# Patient Record
Sex: Female | Born: 1965 | Race: Black or African American | Hispanic: No | Marital: Married | Smoking: Never smoker
Health system: Southern US, Community
[De-identification: ages and names within clinical notes are randomized; demographics above are authoritative.]

## PROBLEM LIST (undated history)

## (undated) ENCOUNTER — Emergency Department (HOSPITAL_COMMUNITY): Admission: EM | Disposition: A | Discharge status: Home / Self Care | Payer: Self-pay

## (undated) DIAGNOSIS — E669 Obesity, unspecified: Secondary | ICD-10-CM

## (undated) DIAGNOSIS — E119 Type 2 diabetes mellitus without complications: Secondary | ICD-10-CM

## (undated) DIAGNOSIS — I10 Essential (primary) hypertension: Secondary | ICD-10-CM

## (undated) HISTORY — DX: Obesity, unspecified: E66.9

---

## 1987-09-28 HISTORY — PX: TUBAL LIGATION: SHX77

## 1999-09-29 ENCOUNTER — Emergency Department (HOSPITAL_COMMUNITY): Admission: EM | Admit: 1999-09-29 | Discharge: 1999-09-29 | Payer: Self-pay | Admitting: Emergency Medicine

## 2001-08-24 ENCOUNTER — Emergency Department (HOSPITAL_COMMUNITY): Admission: EM | Admit: 2001-08-24 | Discharge: 2001-08-24 | Payer: Self-pay | Admitting: Emergency Medicine

## 2001-08-24 ENCOUNTER — Encounter: Payer: Self-pay | Admitting: Emergency Medicine

## 2005-03-12 ENCOUNTER — Emergency Department (HOSPITAL_COMMUNITY): Admission: EM | Admit: 2005-03-12 | Discharge: 2005-03-12 | Payer: Self-pay | Admitting: Family Medicine

## 2005-03-25 ENCOUNTER — Emergency Department (HOSPITAL_COMMUNITY): Admission: EM | Admit: 2005-03-25 | Discharge: 2005-03-25 | Payer: Self-pay | Admitting: Family Medicine

## 2005-03-26 ENCOUNTER — Emergency Department (HOSPITAL_COMMUNITY): Admission: EM | Admit: 2005-03-26 | Discharge: 2005-03-26 | Payer: Self-pay | Admitting: Emergency Medicine

## 2012-11-02 ENCOUNTER — Emergency Department (HOSPITAL_COMMUNITY): Payer: BC Managed Care – PPO

## 2012-11-02 ENCOUNTER — Emergency Department (HOSPITAL_COMMUNITY)
Admission: EM | Admit: 2012-11-02 | Discharge: 2012-11-02 | Disposition: A | Discharge status: Home / Self Care | Payer: BC Managed Care – PPO | Attending: Emergency Medicine | Admitting: Emergency Medicine

## 2012-11-02 ENCOUNTER — Encounter (HOSPITAL_COMMUNITY): Payer: Self-pay | Admitting: Emergency Medicine

## 2012-11-02 DIAGNOSIS — IMO0002 Reserved for concepts with insufficient information to code with codable children: Secondary | ICD-10-CM | POA: Insufficient documentation

## 2012-11-02 DIAGNOSIS — S7010XA Contusion of unspecified thigh, initial encounter: Secondary | ICD-10-CM | POA: Insufficient documentation

## 2012-11-02 DIAGNOSIS — Y92410 Unspecified street and highway as the place of occurrence of the external cause: Secondary | ICD-10-CM | POA: Insufficient documentation

## 2012-11-02 DIAGNOSIS — Z3202 Encounter for pregnancy test, result negative: Secondary | ICD-10-CM | POA: Insufficient documentation

## 2012-11-02 DIAGNOSIS — E119 Type 2 diabetes mellitus without complications: Secondary | ICD-10-CM | POA: Insufficient documentation

## 2012-11-02 DIAGNOSIS — S20219A Contusion of unspecified front wall of thorax, initial encounter: Secondary | ICD-10-CM | POA: Insufficient documentation

## 2012-11-02 DIAGNOSIS — S301XXA Contusion of abdominal wall, initial encounter: Secondary | ICD-10-CM | POA: Insufficient documentation

## 2012-11-02 DIAGNOSIS — S7011XA Contusion of right thigh, initial encounter: Secondary | ICD-10-CM

## 2012-11-02 DIAGNOSIS — Y9389 Activity, other specified: Secondary | ICD-10-CM | POA: Insufficient documentation

## 2012-11-02 HISTORY — DX: Type 2 diabetes mellitus without complications: E11.9

## 2012-11-02 LAB — CBC WITH DIFFERENTIAL/PLATELET
Basophils Absolute: 0 10*3/uL (ref 0.0–0.1)
Basophils Relative: 1 % (ref 0–1)
Eosinophils Absolute: 0.1 10*3/uL (ref 0.0–0.7)
Eosinophils Relative: 2 % (ref 0–5)
HCT: 34.2 % — ABNORMAL LOW (ref 36.0–46.0)
Hemoglobin: 11 g/dL — ABNORMAL LOW (ref 12.0–15.0)
Lymphocytes Relative: 33 % (ref 12–46)
Lymphs Abs: 1.4 10*3/uL (ref 0.7–4.0)
MCH: 22.8 pg — ABNORMAL LOW (ref 26.0–34.0)
MCHC: 32.2 g/dL (ref 30.0–36.0)
MCV: 70.8 fL — ABNORMAL LOW (ref 78.0–100.0)
Monocytes Absolute: 0.3 10*3/uL (ref 0.1–1.0)
Monocytes Relative: 6 % (ref 3–12)
Neutro Abs: 2.5 10*3/uL (ref 1.7–7.7)
Neutrophils Relative %: 58 % (ref 43–77)
Platelets: 256 10*3/uL (ref 150–400)
RBC: 4.83 MIL/uL (ref 3.87–5.11)
RDW: 16.4 % — ABNORMAL HIGH (ref 11.5–15.5)
WBC: 4.3 10*3/uL (ref 4.0–10.5)

## 2012-11-02 LAB — POCT PREGNANCY, URINE: Preg Test, Ur: NEGATIVE

## 2012-11-02 LAB — BASIC METABOLIC PANEL
BUN: 9 mg/dL (ref 6–23)
CO2: 26 mEq/L (ref 19–32)
Calcium: 8.9 mg/dL (ref 8.4–10.5)
Chloride: 105 mEq/L (ref 96–112)
Creatinine, Ser: 0.61 mg/dL (ref 0.50–1.10)
GFR calc Af Amer: >90 mL/min (ref >90)
GFR calc non Af Amer: >90 mL/min (ref >90)
Glucose, Bld: 130 mg/dL — ABNORMAL HIGH (ref 70–99)
Potassium: 3.8 mEq/L (ref 3.5–5.1)
Sodium: 139 mEq/L (ref 135–145)

## 2012-11-02 MED ORDER — SODIUM CHLORIDE 0.9 % IV SOLN
Freq: Once | INTRAVENOUS | Status: AC
  Administered 2012-11-02: 09:00:00 via INTRAVENOUS

## 2012-11-02 MED ORDER — OXYCODONE-ACETAMINOPHEN 5-325 MG PO TABS: 1.0000 | ORAL_TABLET | ORAL | Status: DC | PRN

## 2012-11-02 MED ORDER — HYDROMORPHONE HCL PF 1 MG/ML IJ SOLN
1.0000 mg | Freq: Once | INTRAMUSCULAR | Status: AC
  Administered 2012-11-02: 1 mg via INTRAVENOUS
  Filled 2012-11-02: qty 1

## 2012-11-02 MED ORDER — IOHEXOL 300 MG/ML  SOLN
100.0000 mL | Freq: Once | INTRAMUSCULAR | Status: AC | PRN
  Administered 2012-11-02: 100 mL via INTRAVENOUS

## 2012-11-02 MED ORDER — ONDANSETRON HCL 4 MG/2ML IJ SOLN
4.0000 mg | Freq: Once | INTRAMUSCULAR | Status: AC
  Administered 2012-11-02: 4 mg via INTRAVENOUS
  Filled 2012-11-02: qty 2
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
## 2012-11-07 ENCOUNTER — Emergency Department (INDEPENDENT_AMBULATORY_CARE_PROVIDER_SITE_OTHER)
Admission: EM | Admit: 2012-11-07 | Discharge: 2012-11-07 | Disposition: A | Discharge status: Home / Self Care | Payer: Self-pay | Source: Home / Self Care | Attending: Family Medicine | Admitting: Family Medicine

## 2012-11-07 ENCOUNTER — Encounter (HOSPITAL_COMMUNITY): Payer: Self-pay | Admitting: *Deleted

## 2012-11-07 ENCOUNTER — Emergency Department (INDEPENDENT_AMBULATORY_CARE_PROVIDER_SITE_OTHER): Payer: BC Managed Care – PPO

## 2012-11-07 DIAGNOSIS — M546 Pain in thoracic spine: Secondary | ICD-10-CM

## 2012-11-07 MED ORDER — CYCLOBENZAPRINE HCL 5 MG PO TABS: 5.0000 mg | ORAL_TABLET | Freq: Three times a day (TID) | ORAL | Status: DC | PRN

## 2012-11-07 MED ORDER — KETOROLAC TROMETHAMINE 10 MG PO TABS: 10.0000 mg | ORAL_TABLET | Freq: Four times a day (QID) | ORAL | Status: DC | PRN
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
## 2016-03-07 ENCOUNTER — Ambulatory Visit (HOSPITAL_COMMUNITY)
Admission: EM | Admit: 2016-03-07 | Discharge: 2016-03-07 | Disposition: A | Discharge status: Home / Self Care | Payer: BLUE CROSS/BLUE SHIELD | Attending: Emergency Medicine | Admitting: Emergency Medicine

## 2016-03-07 ENCOUNTER — Encounter (HOSPITAL_COMMUNITY): Payer: Self-pay | Admitting: Emergency Medicine

## 2016-03-07 DIAGNOSIS — B86 Scabies: Secondary | ICD-10-CM

## 2016-03-07 HISTORY — DX: Essential (primary) hypertension: I10

## 2016-03-07 MED ORDER — PERMETHRIN 5 % EX CREA: TOPICAL_CREAM | CUTANEOUS | Status: DC

## 2016-03-07 NOTE — ED Notes (Signed)
Rash, seen by dr Piedad Climeshonig only

## 2016-03-07 NOTE — Discharge Instructions (Signed)

## 2016-03-07 NOTE — ED Provider Notes (Addendum)
CSN: [id number]     Arrival date & time 03/07/16  1341 History   First MD Initiated Contact with Patient 03/07/16 1351     Chief Complaint  Patient presents with  . Rash   (Consider location/radiation/quality/duration/timing/severity/associated sxs/prior Treatment) HPI  She is a 50 year old woman here for evaluation of rash. She states this started last week on her hands and arms. It has spread to her upper chest, neck, and face. It is very itchy, particularly at night. She's been trying cortisone cream which provide some temporary relief of itching, but otherwise has not helped. She does work in the [employer] field. No new medications, detergents, soaps, or products.  Past Medical History  Diagnosis Date  . Diabetes mellitus without complication (HCC)   . Hypertension    No past surgical history on file. Family History  Problem Relation Age of Onset  . Family history unknown: Yes   Social History  Substance Use Topics  . Smoking status: Never Smoker   . Smokeless tobacco: Not on file  . Alcohol Use: No   OB History    No data available     Review of Systems As in history of present illness Allergies  Amoxicillin  Home Medications   Prior to Admission medications   Medication Sig Start Date End Date Taking? Authorizing Provider  metFORMIN (GLUCOPHAGE) 1000 MG tablet Take 1,000 mg by mouth 2 (two) times daily with a meal.   Yes Historical Provider, MD  valsartan (DIOVAN) 160 MG tablet Take 160 mg by mouth daily.   Yes Historical Provider, MD  permethrin (ELIMITE) 5 % cream Apply to entire body.  Leave on for 12 hours, then wash off.  Repeat in 1 week. 03/07/16   Charm RingsErin J Honig, MD   Meds Ordered and Administered this Visit  Medications - No data to display  BP 136/82 mmHg  Pulse 89  Temp(Src) 98.1 F (36.7 C) (Oral)  Resp 18  SpO2 96% No data found.   Physical Exam  Constitutional: She is oriented to person, place, and time. She appears well-developed and  well-nourished. No distress.  Cardiovascular: Normal rate.   Pulmonary/Chest: Effort normal.  Neurological: She is alert and oriented to person, place, and time.  Skin: Rash (small flesh-colored papules on hands and arms with overlying excoriations. She has also had several lesions on the neck and face.) noted.    ED Course  Procedures (including critical care time)  Labs Review Labs Reviewed - No data to display  Imaging Review No results found.    MDM   1. Scabies    Consistent with scabies. We'll treat with permethrin. Handout given. Follow-up as needed.    Charm RingsErin J Honig, MD 03/07/16 1413  Charm RingsErin J Honig, MD 03/07/16 1414

## 2016-03-22 ENCOUNTER — Other Ambulatory Visit: Payer: Self-pay | Admitting: Internal Medicine

## 2016-03-22 DIAGNOSIS — Z1231 Encounter for screening mammogram for malignant neoplasm of breast: Secondary | ICD-10-CM

## 2016-04-08 ENCOUNTER — Ambulatory Visit: Payer: BLUE CROSS/BLUE SHIELD

## 2016-04-20 ENCOUNTER — Ambulatory Visit: Payer: BLUE CROSS/BLUE SHIELD

## 2016-04-20 ENCOUNTER — Inpatient Hospital Stay: Admission: RE | Admit: 2016-04-20 | Payer: BLUE CROSS/BLUE SHIELD | Source: Ambulatory Visit

## 2017-01-26 ENCOUNTER — Ambulatory Visit (INDEPENDENT_AMBULATORY_CARE_PROVIDER_SITE_OTHER): Payer: BLUE CROSS/BLUE SHIELD | Admitting: Podiatry

## 2017-01-26 ENCOUNTER — Ambulatory Visit (INDEPENDENT_AMBULATORY_CARE_PROVIDER_SITE_OTHER): Payer: BLUE CROSS/BLUE SHIELD

## 2017-01-26 ENCOUNTER — Encounter: Payer: Self-pay | Admitting: Podiatry

## 2017-01-26 ENCOUNTER — Other Ambulatory Visit: Payer: Self-pay | Admitting: Podiatry

## 2017-01-26 VITALS — BP 161/103 | HR 84

## 2017-01-26 DIAGNOSIS — M2010 Hallux valgus (acquired), unspecified foot: Secondary | ICD-10-CM | POA: Diagnosis not present

## 2017-01-26 DIAGNOSIS — M129 Arthropathy, unspecified: Secondary | ICD-10-CM | POA: Diagnosis not present

## 2017-01-26 DIAGNOSIS — R52 Pain, unspecified: Secondary | ICD-10-CM

## 2017-01-26 DIAGNOSIS — M722 Plantar fascial fibromatosis: Secondary | ICD-10-CM

## 2017-01-26 MED ORDER — MELOXICAM 15 MG PO TABS: 15.0000 mg | ORAL_TABLET | Freq: Every day | ORAL | 0 refills | Status: DC

## 2017-01-26 NOTE — Progress Notes (Signed)
   Subjective:    Patient ID: Bridget Bates, female    DOB: [birth year], 51 y.o.   MRN: [id number]  HPI this patient presents the office with chief complaint of pain in both feet after standing long periods of time. She says this pain and discomfort has been present for over a year and it is worsening the last few weeks. Patient states that she is diabetic on metformin. Patient points to the bottom of her foot and the big toe joint as areas of pain after standing periods of time. She says she has provided no self treatment nor sought any professional help. No history of trauma is noted. She presents the office today for an evaluation and treatment for her painful diabetic feet    Review of Systems  Eyes: Positive for pain.       Objective:   Physical Exam GENERAL APPEARANCE: Alert, conversant. Appropriately groomed. No acute distress.  VASCULAR: Pedal pulses are  palpable at  [facility] and PT bilateral.  Capillary refill time is immediate to all digits,  Normal temperature gradient.  Digital hair growth is present bilateral  NEUROLOGIC: sensation is normal to 5.07 monofilament at 5/5 sites bilateral.  Light touch is intact bilateral, Muscle strength normal.  MUSCULOSKELETAL: acceptable muscle strength, tone and stability bilateral.  HAV  B/L.  Plantar fascitis center of both feet with left more painful than right foot.  Dorsal midfoot arthritis   DERMATOLOGIC: skin color, texture, and turgor are within normal limits.  No preulcerative lesions or ulcers  are seen, no interdigital maceration noted.  No open lesions present.  Digital nails are asymptomatic. No drainage noted.         Assessment & Plan:  Plantar fascitis  B/L  HAV  B/L  Midfoot arthritis  B/L   IE  , AP and lateral x-rays of both feet were taken. There is calcification noted at both the insertion of the plantar fascia and the insertion of the Achilles tendon.  Dorsal arthritis is noted in the midfoot.  Dorsomedial exostosis noted at  the first MPJ bilateral consistant with HAV.Marland Kitchen  After discussing her  foot pathology  ,  we decided to treat her with power step insoles to be worn in her shoes. We also decided to prescribe Mobic # 30  One daily. She is to return to the office in 2 weeks at which time we will evaluate her progress with the powerstep insoles and Mobic.  RTC 2 weeks.   Helane Gunther DPM

## 2017-02-16 ENCOUNTER — Ambulatory Visit: Payer: BLUE CROSS/BLUE SHIELD | Admitting: Podiatry

## 2017-03-01 ENCOUNTER — Ambulatory Visit: Payer: BLUE CROSS/BLUE SHIELD | Admitting: Podiatry

## 2017-03-08 ENCOUNTER — Ambulatory Visit: Payer: BLUE CROSS/BLUE SHIELD | Admitting: Podiatry

## 2017-10-10 ENCOUNTER — Encounter (HOSPITAL_COMMUNITY): Payer: Self-pay | Admitting: Emergency Medicine

## 2017-10-10 ENCOUNTER — Ambulatory Visit (HOSPITAL_COMMUNITY)
Admission: EM | Admit: 2017-10-10 | Discharge: 2017-10-10 | Disposition: A | Discharge status: Home / Self Care | Payer: BLUE CROSS/BLUE SHIELD | Attending: Family Medicine | Admitting: Family Medicine

## 2017-10-10 DIAGNOSIS — S39012A Strain of muscle, fascia and tendon of lower back, initial encounter: Secondary | ICD-10-CM | POA: Diagnosis not present

## 2017-10-10 DIAGNOSIS — M5489 Other dorsalgia: Secondary | ICD-10-CM

## 2017-10-10 LAB — POCT URINALYSIS DIP (DEVICE)
Bilirubin Urine: NEGATIVE
Glucose, UA: NEGATIVE mg/dL
Hgb urine dipstick: NEGATIVE
Ketones, ur: NEGATIVE mg/dL
Leukocytes, UA: NEGATIVE
Nitrite: NEGATIVE
Protein, ur: 30 mg/dL — AB
Specific Gravity, Urine: 1.02 (ref 1.005–1.030)
Urobilinogen, UA: 0.2 mg/dL (ref 0.0–1.0)
pH: 6 (ref 5.0–8.0)

## 2017-10-10 MED ORDER — CYCLOBENZAPRINE HCL 5 MG PO TABS: 5.0000 mg | ORAL_TABLET | Freq: Every day | ORAL | 0 refills | Status: DC

## 2017-10-10 NOTE — ED Triage Notes (Signed)
PT reports back pain over left back. PT reports pain moves around, but feels muscular in nature. PT has no known strains or injuries.
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
## 2017-10-10 NOTE — Discharge Instructions (Signed)
This appears to be a muscle strain.    Apply heat to the area when possible.  A muscle relaxer to speed healing has been prescribed to be taken before bed.  Your blood pressure was elevated today.  Make sure you remember to take your blood pressure medicine and monitor this.  Report repeated elevated readings to your doctor

## 2017-12-01 ENCOUNTER — Other Ambulatory Visit: Payer: Self-pay | Admitting: Internal Medicine

## 2017-12-01 DIAGNOSIS — Z1231 Encounter for screening mammogram for malignant neoplasm of breast: Secondary | ICD-10-CM

## 2017-12-29 ENCOUNTER — Ambulatory Visit
Admission: RE | Admit: 2017-12-29 | Discharge: 2017-12-29 | Disposition: A | Discharge status: Home / Self Care | Payer: BLUE CROSS/BLUE SHIELD | Source: Ambulatory Visit | Attending: Internal Medicine | Admitting: Internal Medicine

## 2017-12-29 DIAGNOSIS — Z1231 Encounter for screening mammogram for malignant neoplasm of breast: Secondary | ICD-10-CM

## 2018-08-10 ENCOUNTER — Other Ambulatory Visit: Payer: Self-pay | Admitting: Nurse Practitioner

## 2018-08-16 ENCOUNTER — Other Ambulatory Visit: Payer: Self-pay | Admitting: Internal Medicine

## 2018-09-01 ENCOUNTER — Encounter: Payer: Self-pay | Admitting: Internal Medicine

## 2018-09-01 ENCOUNTER — Ambulatory Visit (INDEPENDENT_AMBULATORY_CARE_PROVIDER_SITE_OTHER): Payer: BLUE CROSS/BLUE SHIELD | Admitting: Internal Medicine

## 2018-09-01 VITALS — BP 156/90 | HR 75 | Temp 98.1°F | Ht 60.0 in | Wt 235.6 lb

## 2018-09-01 DIAGNOSIS — I129 Hypertensive chronic kidney disease with stage 1 through stage 4 chronic kidney disease, or unspecified chronic kidney disease: Secondary | ICD-10-CM | POA: Diagnosis not present

## 2018-09-01 DIAGNOSIS — N181 Chronic kidney disease, stage 1: Secondary | ICD-10-CM | POA: Diagnosis not present

## 2018-09-01 DIAGNOSIS — E1122 Type 2 diabetes mellitus with diabetic chronic kidney disease: Secondary | ICD-10-CM | POA: Diagnosis not present

## 2018-09-01 DIAGNOSIS — R0982 Postnasal drip: Secondary | ICD-10-CM | POA: Diagnosis not present

## 2018-09-01 DIAGNOSIS — Z6841 Body Mass Index (BMI) 40.0 and over, adult: Secondary | ICD-10-CM

## 2018-09-01 MED ORDER — METFORMIN HCL 500 MG PO TABS: 500.0000 mg | ORAL_TABLET | Freq: Two times a day (BID) | ORAL | 1 refills | Status: DC

## 2018-09-01 MED ORDER — TELMISARTAN 40 MG PO TABS: 40.0000 mg | ORAL_TABLET | Freq: Every day | ORAL | 1 refills | Status: DC

## 2018-09-01 NOTE — Patient Instructions (Signed)

## 2018-09-02 LAB — CMP14+EGFR
ALT: 6 IU/L (ref 0–32)
AST: 16 IU/L (ref 0–40)
Albumin/Globulin Ratio: 1.2 (ref 1.2–2.2)
Albumin: 4.1 g/dL (ref 3.5–5.5)
Alkaline Phosphatase: 103 IU/L (ref 39–117)
BUN/Creatinine Ratio: 16 (ref 9–23)
BUN: 9 mg/dL (ref 6–24)
Bilirubin Total: 0.4 mg/dL (ref 0.0–1.2)
CO2: 22 mmol/L (ref 20–29)
Calcium: 9.4 mg/dL (ref 8.7–10.2)
Chloride: 100 mmol/L (ref 96–106)
Creatinine, Ser: 0.58 mg/dL (ref 0.57–1.00)
GFR calc Af Amer: 123 mL/min/{1.73_m2} (ref >59)
GFR calc non Af Amer: 107 mL/min/{1.73_m2} (ref >59)
Globulin, Total: 3.5 g/dL (ref 1.5–4.5)
Glucose: 116 mg/dL — ABNORMAL HIGH (ref 65–99)
Potassium: 3.8 mmol/L (ref 3.5–5.2)
Sodium: 139 mmol/L (ref 134–144)
Total Protein: 7.6 g/dL (ref 6.0–8.5)

## 2018-09-02 LAB — HEMOGLOBIN A1C
Est. average glucose Bld gHb Est-mCnc: 166 mg/dL
Hgb A1c MFr Bld: 7.4 % — ABNORMAL HIGH (ref 4.8–5.6)

## 2018-09-02 LAB — LIPID PANEL
Chol/HDL Ratio: 2.9 ratio (ref 0.0–4.4)
Cholesterol, Total: 161 mg/dL (ref 100–199)
HDL: 55 mg/dL (ref >39)
LDL Calculated: 84 mg/dL (ref 0–99)
Triglycerides: 109 mg/dL (ref 0–149)
VLDL Cholesterol Cal: 22 mg/dL (ref 5–40)

## 2018-09-03 ENCOUNTER — Encounter: Payer: Self-pay | Admitting: Internal Medicine

## 2018-09-03 NOTE — Progress Notes (Signed)
Subjective:     Patient ID: Bridget Bates , female    DOB: 21-Jun-1966 , 52 y.o.   MRN: [id number]   Chief Complaint  Patient presents with  . Diabetes  . Hypertension  . Cough    HPI  Diabetes  She presents for her follow-up diabetic visit. She has type 2 diabetes mellitus. Her disease course has been stable. There are no hypoglycemic associated symptoms. Pertinent negatives for hypoglycemia include no headaches. There are no diabetic associated symptoms. Pertinent negatives for diabetes include no blurred vision and no chest pain. There are no hypoglycemic complications. Diabetic complications include nephropathy. Risk factors for coronary artery disease include diabetes mellitus, dyslipidemia, hypertension, obesity and sedentary lifestyle. She is compliant with treatment most of the time.  Hypertension  This is a chronic problem. The current episode started more than 1 year ago. The problem has been gradually improving since onset. Pertinent negatives include no blurred vision, chest pain, headaches, palpitations or shortness of breath.  Cough  This is a recurrent problem. The current episode started 1 to 4 weeks ago. The problem has been gradually improving. The problem occurs every few hours. The cough is non-productive. Pertinent negatives include no chest pain, headaches or shortness of breath.     Past Medical History:  Diagnosis Date  . Diabetes mellitus without complication ([place])   . Hypertension      Family History  Problem Relation Age of Onset  . Diabetes Mother   . Healthy Father      Current Outpatient Medications:  .  cyclobenzaprine (FLEXERIL) 5 MG tablet, Take 1 tablet (5 mg total) by mouth at bedtime., Disp: 7 tablet, Rfl: 0 .  Doxepin HCl 5 % CREA, APPLY 1 GRAM (1 PUMP) TO THE AFFECTED AREA(S) 3 TIMES DAILY AS NEEDED. (0.5 GRAMS=PEA SIZED AMOUNT), Disp: , Rfl: 94 .  glucose blood (ONETOUCH VERIO) test strip, 1 each by Other route as needed for other. Use as  instructed, Disp: , Rfl:  .  metFORMIN (GLUCOPHAGE) 500 MG tablet, Take 1 tablet (500 mg total) by mouth 2 (two) times daily with a meal., Disp: 180 tablet, Rfl: 1 .  simvastatin (ZOCOR) 10 MG tablet, Take 10 mg by mouth daily., Disp: , Rfl:  .  telmisartan (MICARDIS) 40 MG tablet, Take 1 tablet (40 mg total) by mouth daily., Disp: 90 tablet, Rfl: 1   Allergies  Allergen Reactions  . Amoxicillin Hives and Itching     Review of Systems  Constitutional: Negative.   Eyes: Negative for blurred vision.  Respiratory: Positive for cough. Negative for shortness of breath.   Cardiovascular: Negative.  Negative for chest pain and palpitations.  Gastrointestinal: Negative.   Genitourinary: Negative.   Neurological: Negative.  Negative for headaches.  Psychiatric/Behavioral: Negative.      Today's Vitals   09/01/18 1500  BP: (!) 156/90  Pulse: 75  Temp: 98.1 F (36.7 C)  TempSrc: Oral  Weight: 235 lb 9.6 oz (106.9 kg)  Height: 5' (1.524 m)   Body mass index is 46.01 kg/m.   Objective:  Physical Exam  Constitutional: She appears well-developed and well-nourished.  obese  HENT:  Head: Normocephalic and atraumatic.  Right Ear: Hearing, tympanic membrane, external ear and ear canal normal.  Left Ear: Hearing, tympanic membrane, external ear and ear canal normal.  Nose: Nose normal.  Eyes: EOM are normal.  Cardiovascular: Normal rate, regular rhythm and normal heart sounds.  Pulmonary/Chest: Effort normal and breath sounds normal.  Nursing note and vitals  reviewed.       Assessment And Plan:     1. Diabetes mellitus with stage 1 chronic kidney disease ([place])  I will check labs as listed below. She is encouraged to incorporate more exercise into her daily routine. She is also encouraged to avoid sugary beverages and processed foods.   - CMP14+EGFR - Hemoglobin A1c - Lipid Profile  2. Hypertensive nephropathy  Uncontrolled. She does not wish to change her meds. She is  encouraged to avoid adding salt to her foods. She is encouraged to exercise no less than five days weekly.   3. Chronic renal disease, stage I  Chronic.   4. Postnasal drip  She was given samples of [place] AD to take twice daily as needed. She will let me know if her sx persist   5. Class 3 severe obesity due to excess calories with serious comorbidity and body mass index (BMI) of 45.0 to 49.9 in adult [facility])  She is encouraged to strive for BMI less than 40 to decrease cardiac risk.  Again, importance of regular exercise was discussed with the patient.   Maximino Greenland, MD

## 2018-11-21 ENCOUNTER — Other Ambulatory Visit: Payer: Self-pay | Admitting: Internal Medicine

## 2018-12-04 ENCOUNTER — Ambulatory Visit: Payer: BLUE CROSS/BLUE SHIELD | Admitting: Internal Medicine

## 2018-12-04 ENCOUNTER — Encounter: Payer: Self-pay | Admitting: Internal Medicine

## 2018-12-04 ENCOUNTER — Other Ambulatory Visit: Payer: Self-pay | Admitting: Internal Medicine

## 2018-12-04 VITALS — BP 142/90 | HR 82 | Temp 98.3°F | Ht 60.0 in | Wt 233.6 lb

## 2018-12-04 DIAGNOSIS — Z6841 Body Mass Index (BMI) 40.0 and over, adult: Secondary | ICD-10-CM

## 2018-12-04 DIAGNOSIS — N181 Chronic kidney disease, stage 1: Secondary | ICD-10-CM | POA: Diagnosis not present

## 2018-12-04 DIAGNOSIS — E1122 Type 2 diabetes mellitus with diabetic chronic kidney disease: Secondary | ICD-10-CM

## 2018-12-04 DIAGNOSIS — I129 Hypertensive chronic kidney disease with stage 1 through stage 4 chronic kidney disease, or unspecified chronic kidney disease: Secondary | ICD-10-CM

## 2018-12-04 MED ORDER — TELMISARTAN 80 MG PO TABS: 80.0000 mg | ORAL_TABLET | Freq: Every day | ORAL | 1 refills | Status: DC

## 2018-12-04 NOTE — Progress Notes (Signed)
Subjective:     Patient ID: Bridget Bates , female    DOB: 11-21-1965 , 53 y.o.   MRN: [id number]   Chief Complaint  Patient presents with  . Hypertension  . Diabetes    HPI  Hypertension  This is a chronic problem. The current episode started more than 1 year ago. The problem has been gradually improving since onset. The problem is uncontrolled. Pertinent negatives include no blurred vision, chest pain, palpitations or shortness of breath. Risk factors for coronary artery disease include diabetes mellitus, dyslipidemia and obesity. Hypertensive end-organ damage includes kidney disease.  Diabetes  She presents for her follow-up diabetic visit. She has type 2 diabetes mellitus. There are no hypoglycemic associated symptoms. Pertinent negatives for diabetes include no blurred vision and no chest pain. There are no hypoglycemic complications.     Past Medical History:  Diagnosis Date  . Diabetes mellitus without complication ([place])   . Hypertension      Family History  Problem Relation Age of Onset  . Diabetes Mother   . Healthy Father      Current Outpatient Medications:  .  glucose blood (ONETOUCH VERIO) test strip, 1 each by Other route as needed for other. Use as instructed, Disp: , Rfl:  .  metFORMIN (GLUCOPHAGE) 500 MG tablet, TAKE 1 TABLET BY MOUTH TWICE DAILY BREAKFAST AND EVENING MEAL, Disp: 180 tablet, Rfl: 1 .  simvastatin (ZOCOR) 10 MG tablet, Take 10 mg by mouth daily., Disp: , Rfl:  .  telmisartan (MICARDIS) 40 MG tablet, Take 1 tablet (40 mg total) by mouth daily., Disp: 90 tablet, Rfl: 1   Allergies  Allergen Reactions  . Amoxicillin Hives and Itching     Review of Systems  Constitutional: Negative.   Eyes: Negative for blurred vision.  Respiratory: Negative.  Negative for shortness of breath.   Cardiovascular: Negative.  Negative for chest pain and palpitations.  Gastrointestinal: Negative.   Neurological: Negative.   Psychiatric/Behavioral: Negative.       Today's Vitals   12/04/18 1149  BP: (!) 142/90  Pulse: 82  Temp: 98.3 F (36.8 C)  TempSrc: Oral  Weight: 233 lb 9.6 oz (106 kg)  Height: 5' (1.524 m)   Body mass index is 45.62 kg/m.   Objective:  Physical Exam Vitals signs and nursing note reviewed.  Constitutional:      Appearance: Normal appearance.  HENT:     Head: Normocephalic and atraumatic.  Cardiovascular:     Rate and Rhythm: Normal rate and regular rhythm.     Heart sounds: Normal heart sounds.  Pulmonary:     Effort: Pulmonary effort is normal.     Breath sounds: Normal breath sounds.  Skin:    General: Skin is warm.  Neurological:     General: No focal deficit present.     Mental Status: She is alert.  Psychiatric:        Mood and Affect: Mood normal.        Behavior: Behavior normal.         Assessment And Plan:     1. Hypertensive nephropathy  Fair control. I will increase telmisartan 42m to 854monce daily. She will take 2 tablets until she runs out. I will also send rx telmisartan 8071mnce daily. She will rto in 6 weeks for re-evaluation.   - HIV antibody (with reflex)  2. Chronic renal disease, stage I  Chronic. I will check GFR, Cr today.   3. Diabetes mellitus with stage 1  chronic kidney disease ([place])  I will check labs as listed below.  She is encouraged to exercise 30 minutes five days weekly.   - TSH - BMP8+EGFR - Hemoglobin A1c  4. Class 3 severe obesity due to excess calories with serious comorbidity and body mass index (BMI) of 45.0 to 49.9 in adult [facility])  Importance of achieving optimal weight to decrease risk of cardiovascular disease and cancers was discussed with the patient in full detail. She is encouraged to start slowly - start with 10 minutes twice daily at least three to four days per week and to gradually build to 30 minutes five days weekly. She was given tips to incorporate more activity into her daily routine - take stairs when possible, park farther away  from her job, grocery stores, etc. .     Maximino Greenland, MD

## 2018-12-04 NOTE — Patient Instructions (Signed)

## 2018-12-05 LAB — BMP8+EGFR
BUN/Creatinine Ratio: 14 (ref 9–23)
BUN: 8 mg/dL (ref 6–24)
CO2: 22 mmol/L (ref 20–29)
Calcium: 9.3 mg/dL (ref 8.7–10.2)
Chloride: 100 mmol/L (ref 96–106)
Creatinine, Ser: 0.56 mg/dL — ABNORMAL LOW (ref 0.57–1.00)
GFR calc Af Amer: 125 mL/min/{1.73_m2} (ref >59)
GFR calc non Af Amer: 108 mL/min/{1.73_m2} (ref >59)
Glucose: 118 mg/dL — ABNORMAL HIGH (ref 65–99)
Potassium: 4.2 mmol/L (ref 3.5–5.2)
Sodium: 139 mmol/L (ref 134–144)

## 2018-12-05 LAB — HEMOGLOBIN A1C
Est. average glucose Bld gHb Est-mCnc: 160 mg/dL
Hgb A1c MFr Bld: 7.2 % — ABNORMAL HIGH (ref 4.8–5.6)

## 2018-12-05 LAB — HIV ANTIBODY (ROUTINE TESTING W REFLEX): HIV Screen 4th Generation wRfx: NONREACTIVE

## 2018-12-05 LAB — TSH: TSH: 0.558 u[IU]/mL (ref 0.450–4.500)

## 2019-01-10 ENCOUNTER — Other Ambulatory Visit: Payer: Self-pay | Admitting: Internal Medicine

## 2019-01-17 ENCOUNTER — Ambulatory Visit: Payer: BLUE CROSS/BLUE SHIELD | Admitting: Internal Medicine

## 2019-03-06 ENCOUNTER — Other Ambulatory Visit: Payer: Self-pay | Admitting: Internal Medicine

## 2019-03-09 ENCOUNTER — Telehealth: Payer: Self-pay

## 2019-03-09 ENCOUNTER — Other Ambulatory Visit: Payer: Self-pay

## 2019-03-09 MED ORDER — TELMISARTAN 80 MG PO TABS: ORAL_TABLET | ORAL | 0 refills | Status: DC

## 2019-03-09 NOTE — Telephone Encounter (Signed)
The pt called and wanted to cancel her appt for Monday because her sister has the coronavirus, her son went to the sister's house and now she has to be tested.  The pt said that her job set her up to have the test done and she gets it done today.

## 2019-03-12 ENCOUNTER — Encounter: Payer: BLUE CROSS/BLUE SHIELD | Admitting: Internal Medicine

## 2019-03-16 ENCOUNTER — Other Ambulatory Visit: Payer: Self-pay | Admitting: Internal Medicine

## 2019-04-02 ENCOUNTER — Encounter: Payer: Self-pay | Admitting: Nurse Practitioner

## 2019-04-02 ENCOUNTER — Other Ambulatory Visit: Payer: Self-pay | Admitting: Internal Medicine

## 2019-04-02 ENCOUNTER — Ambulatory Visit: Payer: BC Managed Care – PPO | Admitting: Internal Medicine

## 2019-04-02 ENCOUNTER — Other Ambulatory Visit: Payer: Self-pay

## 2019-04-02 VITALS — BP 160/98 | HR 100 | Temp 98.5°F | Ht 60.0 in | Wt 229.6 lb

## 2019-04-02 DIAGNOSIS — N181 Chronic kidney disease, stage 1: Secondary | ICD-10-CM

## 2019-04-02 DIAGNOSIS — Z0000 Encounter for general adult medical examination without abnormal findings: Secondary | ICD-10-CM

## 2019-04-02 DIAGNOSIS — I129 Hypertensive chronic kidney disease with stage 1 through stage 4 chronic kidney disease, or unspecified chronic kidney disease: Secondary | ICD-10-CM | POA: Diagnosis not present

## 2019-04-02 DIAGNOSIS — E1122 Type 2 diabetes mellitus with diabetic chronic kidney disease: Secondary | ICD-10-CM | POA: Diagnosis not present

## 2019-04-02 DIAGNOSIS — N1831 Chronic kidney disease, stage 3a: Secondary | ICD-10-CM | POA: Insufficient documentation

## 2019-04-02 LAB — POCT UA - MICROALBUMIN
Albumin/Creatinine Ratio, Urine, POC: 300
Creatinine, POC: 200 mg/dL
Microalbumin Ur, POC: 150 mg/L

## 2019-04-02 LAB — POCT URINALYSIS DIPSTICK
Bilirubin, UA: NEGATIVE
Blood, UA: NEGATIVE
Glucose, UA: NEGATIVE
Ketones, UA: NEGATIVE
Leukocytes, UA: NEGATIVE
Nitrite, UA: NEGATIVE
Protein, UA: NEGATIVE
Spec Grav, UA: 1.02 (ref 1.010–1.025)
Urobilinogen, UA: 0.2 E.U./dL
pH, UA: 5.5 (ref 5.0–8.0)

## 2019-04-02 MED ORDER — CHLORTHALIDONE 25 MG PO TABS: 25.0000 mg | ORAL_TABLET | Freq: Every day | ORAL | 1 refills | Status: DC

## 2019-04-02 NOTE — Progress Notes (Signed)
Subjective:     Patient ID: Bridget Bates , female    DOB: 27-Sep-1966 , 53 y.o.   MRN: [id number]   Chief Complaint  Patient presents with  . Annual Exam    HPI  She is here today for a full physical examination. She is followed by GYN for her pelvic exams.   Diabetes She presents for her follow-up diabetic visit. She has type 2 diabetes mellitus. Her disease course has been stable. There are no hypoglycemic associated symptoms. Pertinent negatives for diabetes include no blurred vision and no chest pain. There are no hypoglycemic complications. Risk factors for coronary artery disease include diabetes mellitus, dyslipidemia, hypertension, sedentary lifestyle and obesity. She never participates in exercise. Her breakfast blood glucose is taken between 8-9 am. Her breakfast blood glucose range is generally 110-130 mg/dl. An ACE inhibitor/angiotensin II receptor blocker is being taken. Eye exam is not current.  Hypertension This is a chronic problem. The current episode started more than 1 year ago. The problem has been gradually improving since onset. The problem is uncontrolled. Pertinent negatives include no blurred vision, chest pain, palpitations or shortness of breath.     Past Medical History:  Diagnosis Date  . Diabetes mellitus without complication ([place])   . Hypertension      Family History  Problem Relation Age of Onset  . Diabetes Mother   . Healthy Father      Current Outpatient Medications:  .  glucose blood (ONETOUCH VERIO) test strip, 1 each by Other route as needed for other. Use as instructed, Disp: , Rfl:  .  metFORMIN (GLUCOPHAGE) 500 MG tablet, TAKE 1 TABLET BY MOUTH TWICE DAILY BREAKFAST AND EVENING MEAL, Disp: 180 tablet, Rfl: 1 .  simvastatin (ZOCOR) 10 MG tablet, TAKE 1 TABLET BY MOUTH EVERY DAY IN THE EVENING, Disp: 90 tablet, Rfl: 0 .  telmisartan (MICARDIS) 80 MG tablet, TAKE 1 TABLET(80 MG) BY MOUTH DAILY, Disp: 90 tablet, Rfl: 0 .  chlorthalidone  (HYGROTON) 25 MG tablet, Take 1 tablet (25 mg total) by mouth daily., Disp: 30 tablet, Rfl: 1   Allergies  Allergen Reactions  . Amoxicillin Hives and Itching     The patient states she uses none for birth control. Last LMP was No LMP recorded. (Menstrual status: Perimenopausal).. Negative for Dysmenorrhea Negative for: breast discharge, breast lump(s), breast pain and breast self exam. Associated symptoms include abnormal vaginal bleeding. Pertinent negatives include abnormal bleeding (hematology), anxiety, decreased libido, depression, difficulty falling sleep, dyspareunia, history of infertility, nocturia, sexual dysfunction, sleep disturbances, urinary incontinence, urinary urgency, vaginal discharge and vaginal itching. Diet regular.The patient states her exercise level is  minimal.   . The patient's tobacco use is:  Social History   Tobacco Use  Smoking Status Never Smoker  Smokeless Tobacco Never Used  . She has been exposed to passive smoke. The patient's alcohol use is:  Social History   Substance and Sexual Activity  Alcohol Use No     Review of Systems  Constitutional: Negative.   HENT: Negative.   Eyes: Negative.  Negative for blurred vision.  Respiratory: Negative.  Negative for shortness of breath.   Cardiovascular: Negative.  Negative for chest pain and palpitations.  Endocrine: Negative.   Genitourinary: Negative.   Musculoskeletal: Negative.   Skin: Negative.   Allergic/Immunologic: Negative.   Neurological: Negative.   Hematological: Negative.   Psychiatric/Behavioral: Negative.      Today's Vitals   04/02/19 1210  BP: (!) 160/98  Pulse: 100  Temp:  98.5 F (36.9 C)  TempSrc: Oral  Weight: 229 lb 9.6 oz (104.1 kg)  Height: 5' (1.524 m)  PainSc: 0-No pain   Body mass index is 44.84 kg/m.   Objective:  Physical Exam Vitals signs and nursing note reviewed.  Constitutional:      Appearance: Normal appearance. She is obese.  HENT:     Head:  Normocephalic and atraumatic.     Right Ear: Tympanic membrane, ear canal and external ear normal.     Left Ear: Tympanic membrane, ear canal and external ear normal.     Nose: Nose normal.     Mouth/Throat:     Mouth: Mucous membranes are moist.     Pharynx: Oropharynx is clear.  Eyes:     Extraocular Movements: Extraocular movements intact.     Conjunctiva/sclera: Conjunctivae normal.     Pupils: Pupils are equal, round, and reactive to light.  Neck:     Musculoskeletal: Normal range of motion and neck supple.  Cardiovascular:     Rate and Rhythm: Normal rate and regular rhythm.     Pulses: Normal pulses.          Dorsalis pedis pulses are 2+ on the right side and 2+ on the left side.       Posterior tibial pulses are 2+ on the right side and 2+ on the left side.     Heart sounds: Normal heart sounds.  Pulmonary:     Effort: Pulmonary effort is normal.     Breath sounds: Normal breath sounds.  Chest:     Breasts:        Right: Normal. No swelling, bleeding, inverted nipple, mass, nipple discharge or skin change.        Left: Normal. No swelling, bleeding, inverted nipple, mass, nipple discharge or skin change.  Abdominal:     General: Abdomen is flat. Bowel sounds are normal.     Palpations: Abdomen is soft.  Genitourinary:    Comments: deferred Musculoskeletal: Normal range of motion.  Feet:     Right foot:     Protective Sensation: 5 sites tested. 5 sites sensed.     Skin integrity: Skin integrity normal.     Toenail Condition: Right toenails are normal.     Left foot:     Protective Sensation: 5 sites tested. 5 sites sensed.     Skin integrity: Skin integrity normal.     Toenail Condition: Left toenails are normal.  Skin:    General: Skin is warm and dry.  Neurological:     General: No focal deficit present.     Mental Status: She is alert and oriented to person, place, and time.  Psychiatric:        Mood and Affect: Mood normal.        Behavior: Behavior normal.          Assessment And Plan:     1. Routine general medical examination at health care facility  A full exam was performed. Importance of monthly self breast exams was discussed with the patient. I will request a copy of her colonoscopy report from Dr. Collene Mares.  PATIENT HAS BEEN ADVISED TO GET 30-45 MINUTES REGULAR EXERCISE NO LESS THAN FOUR TO FIVE DAYS PER WEEK - BOTH WEIGHTBEARING EXERCISES AND AEROBIC ARE RECOMMENDED.  SHE IS ADVISED TO FOLLOW A HEALTHY DIET WITH AT LEAST SIX FRUITS/VEGGIES PER DAY, DECREASE INTAKE OF RED MEAT, AND TO INCREASE FISH INTAKE TO TWO DAYS PER WEEK.  MEATS/FISH SHOULD  NOT BE FRIED, BAKED OR BROILED IS PREFERABLE.  I SUGGEST WEARING SPF 50 SUNSCREEN ON EXPOSED PARTS AND ESPECIALLY WHEN IN THE DIRECT SUNLIGHT FOR AN EXTENDED PERIOD OF TIME.  PLEASE AVOID FAST FOOD RESTAURANTS AND INCREASE YOUR WATER INTAKE.  - Lipid Profile - CBC no Diff - Vitamin D (25 hydroxy) - Hemoglobin A1c - CMP14+EGFR  2. 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  WE DISCUSSED STARTING OZEMPIC TO HELP HER ACHIEVE GLYCEMIC CONTROL. SHE WILL START WITH 0.25MG ONCE WEEKLY X 4 WEEKS, THEN 0.5MG ONCE WEEKLY X 2 WEEKS. SHE IS GLP-1 NAIVE. SHE WAS INSTRUCTED ON HOW TO SELF ADMINISTER THE MEDICATION.  SHE DENIES FAMILY HISTORY OF THYROID CANCER. SHE WILL RTO IN SIX WEEKS FOR RE-EVALUATION. DISCUSSED WITH THE PATIENT AT LENGTH REGARDING THE GOALS OF GLYCEMIC CONTROL AND POSSIBLE LONG-TERM COMPLICATIONS.  I  ALSO STRESSED THE IMPORTANCE OF COMPLIANCE WITH HOME GLUCOSE MONITORING, DIETARY RESTRICTIONS INCLUDING AVOIDANCE OF SUGARY DRINKS/PROCESSED FOODS,  ALONG WITH REGULAR EXERCISE.  I  ALSO STRESSED THE IMPORTANCE OF ANNUAL EYE EXAMS, SELF FOOT CARE AND COMPLIANCE WITH OFFICE VISITS.   3. Hypertensive nephropathy  Uncontrolled. I will add chlorthalidone 51m once daily to her current regimen. She agrees to rto in four weeks for re-evaluation. I will  check a BMET at that time. EKG performed, no acute changes noted. She is encouraged to avoid adding salt to her foods and to incorporate more exercise into her daily routine.   - EKG 12-Lead - POCT Urinalysis Dipstick (([id number] - POCT UA - Microalbumin       RMaximino Greenland MD    THE PATIENT IS ENCOURAGED TO PRACTICE SOCIAL DISTANCING DUE TO THE COVID-19 PANDEMIC.

## 2019-04-02 NOTE — Patient Instructions (Signed)
Health Maintenance, Female Adopting a healthy lifestyle and getting preventive care are important in promoting health and wellness. Ask your health care provider about:  The right schedule for you to have regular tests and exams.  Things you can do on your own to prevent diseases and keep yourself healthy. What should I know about diet, weight, and exercise? Eat a healthy diet   Eat a diet that includes plenty of vegetables, fruits, low-fat dairy products, and lean protein.  Do not eat a lot of foods that are high in solid fats, added sugars, or sodium. Maintain a healthy weight Body mass index (BMI) is used to identify weight problems. It estimates body fat based on height and weight. Your health care provider can help determine your BMI and help you achieve or maintain a healthy weight. Get regular exercise Get regular exercise. This is one of the most important things you can do for your health. Most adults should:  Exercise for at least 150 minutes each week. The exercise should increase your heart rate and make you sweat (moderate-intensity exercise).  Do strengthening exercises at least twice a week. This is in addition to the moderate-intensity exercise.  Spend less time sitting. Even light physical activity can be beneficial. Watch cholesterol and blood lipids Have your blood tested for lipids and cholesterol at 52 years of age, then have this test every 5 years. Have your cholesterol levels checked more often if:  Your lipid or cholesterol levels are high.  You are older than 52 years of age.  You are at high risk for heart disease. What should I know about cancer screening? Depending on your health history and family history, you may need to have cancer screening at various ages. This may include screening for:  Breast cancer.  Cervical cancer.  Colorectal cancer.  Skin cancer.  Lung cancer. What should I know about heart disease, diabetes, and high blood  pressure? Blood pressure and heart disease  High blood pressure causes heart disease and increases the risk of stroke. This is more likely to develop in people who have high blood pressure readings, are of African descent, or are overweight.  Have your blood pressure checked: ? Every 3-5 years if you are 18-39 years of age. ? Every year if you are 40 years old or older. Diabetes Have regular diabetes screenings. This checks your fasting blood sugar level. Have the screening done:  Once every three years after age 40 if you are at a normal weight and have a low risk for diabetes.  More often and at a younger age if you are overweight or have a high risk for diabetes. What should I know about preventing infection? Hepatitis B If you have a higher risk for hepatitis B, you should be screened for this virus. Talk with your health care provider to find out if you are at risk for hepatitis B infection. Hepatitis C Testing is recommended for:  Everyone born from 1945 through 1965.  Anyone with known risk factors for hepatitis C. Sexually transmitted infections (STIs)  Get screened for STIs, including gonorrhea and chlamydia, if: ? You are sexually active and are younger than 52 years of age. ? You are older than 52 years of age and your health care provider tells you that you are at risk for this type of infection. ? Your sexual activity has changed since you were last screened, and you are at increased risk for chlamydia or gonorrhea. Ask your health care provider if   you are at risk.  Ask your health care provider about whether you are at high risk for HIV. Your health care provider may recommend a prescription medicine to help prevent HIV infection. If you choose to take medicine to prevent HIV, you should first get tested for HIV. You should then be tested every 3 months for as long as you are taking the medicine. Pregnancy  If you are about to stop having your period (premenopausal) and  you may become pregnant, seek counseling before you get pregnant.  Take 400 to 800 micrograms (mcg) of folic acid every day if you become pregnant.  Ask for birth control (contraception) if you want to prevent pregnancy. Osteoporosis and menopause Osteoporosis is a disease in which the bones lose minerals and strength with aging. This can result in bone fractures. If you are 65 years old or older, or if you are at risk for osteoporosis and fractures, ask your health care provider if you should:  Be screened for bone loss.  Take a calcium or vitamin D supplement to lower your risk of fractures.  Be given hormone replacement therapy (HRT) to treat symptoms of menopause. Follow these instructions at home: Lifestyle  Do not use any products that contain nicotine or tobacco, such as cigarettes, e-cigarettes, and chewing tobacco. If you need help quitting, ask your health care provider.  Do not use street drugs.  Do not share needles.  Ask your health care provider for help if you need support or information about quitting drugs. Alcohol use  Do not drink alcohol if: ? Your health care provider tells you not to drink. ? You are pregnant, may be pregnant, or are planning to become pregnant.  If you drink alcohol: ? Limit how much you use to 0-1 drink a day. ? Limit intake if you are breastfeeding.  Be aware of how much alcohol is in your drink. In the [place] one drink equals one 12 oz bottle of beer (355 mL), one 5 oz glass of wine (148 mL), or one 1 oz glass of hard liquor (44 mL). General instructions  Schedule regular health, dental, and eye exams.  Stay current with your vaccines.  Tell your health care provider if: ? You often feel depressed. ? You have ever been abused or do not feel safe at home. Summary  Adopting a healthy lifestyle and getting preventive care are important in promoting health and wellness.  Follow your health care provider's instructions about healthy  diet, exercising, and getting tested or screened for diseases.  Follow your health care provider's instructions on monitoring your cholesterol and blood pressure. This information is not intended to replace advice given to you by your health care provider. Make sure you discuss any questions you have with your health care provider. Document Released: 03/29/2011 Document Revised: 09/06/2018 Document Reviewed: 09/06/2018 Elsevier Patient Education  2020 [employer] Inc[employer]  

## 2019-04-03 LAB — CMP14+EGFR
ALT: 6 IU/L (ref 0–32)
AST: 14 IU/L (ref 0–40)
Albumin/Globulin Ratio: 1.3 (ref 1.2–2.2)
Albumin: 4 g/dL (ref 3.8–4.9)
Alkaline Phosphatase: 105 IU/L (ref 39–117)
BUN/Creatinine Ratio: 20 (ref 9–23)
BUN: 12 mg/dL (ref 6–24)
Bilirubin Total: 0.3 mg/dL (ref 0.0–1.2)
CO2: 22 mmol/L (ref 20–29)
Calcium: 9.7 mg/dL (ref 8.7–10.2)
Chloride: 106 mmol/L (ref 96–106)
Creatinine, Ser: 0.59 mg/dL (ref 0.57–1.00)
GFR calc Af Amer: 123 mL/min/{1.73_m2} (ref >59)
GFR calc non Af Amer: 106 mL/min/{1.73_m2} (ref >59)
Globulin, Total: 3.2 g/dL (ref 1.5–4.5)
Glucose: 115 mg/dL — ABNORMAL HIGH (ref 65–99)
Potassium: 4.3 mmol/L (ref 3.5–5.2)
Sodium: 143 mmol/L (ref 134–144)
Total Protein: 7.2 g/dL (ref 6.0–8.5)

## 2019-04-03 LAB — CBC
Hematocrit: 37.7 % (ref 34.0–46.6)
Hemoglobin: 11.9 g/dL (ref 11.1–15.9)
MCH: 22.9 pg — ABNORMAL LOW (ref 26.6–33.0)
MCHC: 31.6 g/dL (ref 31.5–35.7)
MCV: 73 fL — ABNORMAL LOW (ref 79–97)
Platelets: 269 10*3/uL (ref 150–450)
RBC: 5.2 x10E6/uL (ref 3.77–5.28)
RDW: 15.3 % (ref 11.7–15.4)
WBC: 5 10*3/uL (ref 3.4–10.8)

## 2019-04-03 LAB — LIPID PANEL
Chol/HDL Ratio: 2.9 ratio (ref 0.0–4.4)
Cholesterol, Total: 148 mg/dL (ref 100–199)
HDL: 51 mg/dL (ref >39)
LDL Calculated: 67 mg/dL (ref 0–99)
Triglycerides: 151 mg/dL — ABNORMAL HIGH (ref 0–149)
VLDL Cholesterol Cal: 30 mg/dL (ref 5–40)

## 2019-04-03 LAB — VITAMIN D 25 HYDROXY (VIT D DEFICIENCY, FRACTURES): Vit D, 25-Hydroxy: 23.1 ng/mL — ABNORMAL LOW (ref 30.0–100.0)

## 2019-04-03 LAB — HEMOGLOBIN A1C
Est. average glucose Bld gHb Est-mCnc: 160 mg/dL
Hgb A1c MFr Bld: 7.2 % — ABNORMAL HIGH (ref 4.8–5.6)

## 2019-04-09 ENCOUNTER — Other Ambulatory Visit: Payer: Self-pay

## 2019-04-09 MED ORDER — VITAMIN D (ERGOCALCIFEROL) 1.25 MG (50000 UNIT) PO CAPS: 50000.0000 [IU] | ORAL_CAPSULE | ORAL | 3 refills | Status: DC

## 2019-04-10 ENCOUNTER — Telehealth: Payer: Self-pay

## 2019-04-10 ENCOUNTER — Other Ambulatory Visit: Payer: Self-pay

## 2019-04-10 NOTE — Telephone Encounter (Signed)
The pt was notified of her most resent lab results.

## 2019-04-13 ENCOUNTER — Other Ambulatory Visit: Payer: Self-pay | Admitting: Internal Medicine

## 2019-04-30 ENCOUNTER — Encounter: Payer: Self-pay | Admitting: Internal Medicine

## 2019-04-30 ENCOUNTER — Other Ambulatory Visit: Payer: Self-pay

## 2019-04-30 ENCOUNTER — Ambulatory Visit: Payer: BC Managed Care – PPO | Admitting: Internal Medicine

## 2019-04-30 VITALS — BP 122/80 | HR 86 | Temp 98.6°F | Ht 60.0 in | Wt 221.0 lb

## 2019-04-30 DIAGNOSIS — N181 Chronic kidney disease, stage 1: Secondary | ICD-10-CM

## 2019-04-30 DIAGNOSIS — Z23 Encounter for immunization: Secondary | ICD-10-CM

## 2019-04-30 DIAGNOSIS — E661 Drug-induced obesity: Secondary | ICD-10-CM

## 2019-04-30 DIAGNOSIS — I129 Hypertensive chronic kidney disease with stage 1 through stage 4 chronic kidney disease, or unspecified chronic kidney disease: Secondary | ICD-10-CM

## 2019-04-30 DIAGNOSIS — Z6841 Body Mass Index (BMI) 40.0 and over, adult: Secondary | ICD-10-CM

## 2019-04-30 DIAGNOSIS — E1122 Type 2 diabetes mellitus with diabetic chronic kidney disease: Secondary | ICD-10-CM | POA: Diagnosis not present

## 2019-04-30 MED ORDER — OZEMPIC (0.25 OR 0.5 MG/DOSE) 2 MG/1.5ML ~~LOC~~ SOPN: 0.5000 mg | PEN_INJECTOR | SUBCUTANEOUS | 1 refills | Status: DC

## 2019-04-30 NOTE — Progress Notes (Signed)
Subjective:     Patient ID: Bridget Bates , female    DOB: July 02, 1966 , 53 y.o.   MRN: [id number]   Chief Complaint  Patient presents with  . Hypertension  . Diabetes    HPI  She is here today for bp check.  She was started on chlorthalidone at her last visit due to itching from hydrochlorothiazide. She has tolerated this medication without any issues. Additionally, she is here today for f/u Ozempic. She is currently using 0.25mg  once weekly on Mondays. She is not having any issues with this. She did not have any side effects.  She has noticed her sugars have improved while her appetite has decreased as well.     Past Medical History:  Diagnosis Date  . Diabetes mellitus without complication (HCC)   . Hypertension      Family History  Problem Relation Age of Onset  . Diabetes Mother   . Healthy Father      Current Outpatient Medications:  .  chlorthalidone (HYGROTON) 25 MG tablet, TAKE 1 TABLET(25 MG) BY MOUTH DAILY, Disp: 90 tablet, Rfl: 1 .  glucose blood (ONETOUCH VERIO) test strip, 1 each by Other route as needed for other. Use as instructed, Disp: , Rfl:  .  metFORMIN (GLUCOPHAGE) 500 MG tablet, TAKE 1 TABLET BY MOUTH TWICE DAILY BREAKFAST AND EVENING MEAL, Disp: 180 tablet, Rfl: 1 .  Semaglutide,0.25 or 0.5MG /DOS, (OZEMPIC, 0.25 OR 0.5 MG/DOSE,) 2 MG/1.5ML SOPN, Inject 0.5 mg into the skin once a week., Disp: 3 pen, Rfl: 1 .  simvastatin (ZOCOR) 10 MG tablet, TAKE 1 TABLET BY MOUTH EVERY DAY IN THE EVENING, Disp: 90 tablet, Rfl: 0 .  telmisartan (MICARDIS) 80 MG tablet, TAKE 1 TABLET(80 MG) BY MOUTH DAILY, Disp: 90 tablet, Rfl: 0 .  Vitamin D, Ergocalciferol, (DRISDOL) 1.25 MG (50000 UT) CAPS capsule, Take 1 capsule (50,000 Units total) by mouth as directed. Take 1 capsule by mouth on Tuesday and Fridays., Disp: 8 capsule, Rfl: 3   Allergies  Allergen Reactions  . Amoxicillin Hives and Itching     Review of Systems  Constitutional: Negative.   Respiratory: Negative.    Cardiovascular: Negative.   Gastrointestinal: Negative.   Neurological: Negative.   Psychiatric/Behavioral: Negative.      Today's Vitals   04/30/19 1506  BP: 122/80  Pulse: 86  Temp: 98.6 F (37 C)  TempSrc: Oral  Weight: 221 lb (100.2 kg)  Height: 5' (1.524 m)   Body mass index is 43.16 kg/m.   Objective:  Physical Exam Vitals signs and nursing note reviewed.  Constitutional:      Appearance: Normal appearance.  HENT:     Head: Normocephalic and atraumatic.  Cardiovascular:     Rate and Rhythm: Normal rate and regular rhythm.     Heart sounds: Normal heart sounds.  Pulmonary:     Effort: Pulmonary effort is normal.     Breath sounds: Normal breath sounds.  Skin:    General: Skin is warm.  Neurological:     General: No focal deficit present.     Mental Status: She is alert.  Psychiatric:        Mood and Affect: Mood normal.        Behavior: Behavior normal.         Assessment And Plan:     1. 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 I will send in rx  to the pharmacy for 0.5mg  once weekly. She will rto in October 2020 for her next diabetes check. I will also refer her for diabetic eye exam.   - Ambulatory referral to Optometry  2. Hypertensive nephropathy  Well controlled. She will continue with current meds.   3. Need for vaccination  She was given Pneumovax-23 to update her immunizations.   4. 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 She will rto in Oct 2020 for her next evaluation.   Maximino Greenland, MD    THE PATIENT IS ENCOURAGED TO PRACTICE SOCIAL DISTANCING DUE TO THE COVID-19 PANDEMIC.

## 2019-04-30 NOTE — Patient Instructions (Signed)

## 2019-05-08 ENCOUNTER — Encounter: Payer: Self-pay | Admitting: Internal Medicine

## 2019-05-15 ENCOUNTER — Telehealth: Payer: Self-pay | Admitting: Internal Medicine

## 2019-05-15 NOTE — Telephone Encounter (Signed)
PT CALLED WANTED TO KNOW IF SHE NEEDED TO CONTINUE TO TAKE VIT D THAT SHE STARTED, REVIEWED PT'S LABS ADVISED HER THAT SHE WILL NEED TO CONTINUE BECAUSE VIT D LEVELS WERE ABNORMAL SO THE PROVIDER WILL LET HER KNOW WHEN TO DISCONTINUE

## 2019-05-29 ENCOUNTER — Other Ambulatory Visit: Payer: Self-pay | Admitting: Internal Medicine

## 2019-06-05 ENCOUNTER — Other Ambulatory Visit: Payer: Self-pay

## 2019-06-05 MED ORDER — TELMISARTAN 80 MG PO TABS: ORAL_TABLET | ORAL | 2 refills | Status: DC

## 2019-07-09 ENCOUNTER — Encounter: Payer: Self-pay | Admitting: Internal Medicine

## 2019-07-09 ENCOUNTER — Ambulatory Visit: Payer: BC Managed Care – PPO | Admitting: Internal Medicine

## 2019-07-09 ENCOUNTER — Other Ambulatory Visit: Payer: Self-pay

## 2019-07-09 VITALS — BP 122/76 | HR 94 | Temp 98.5°F | Ht 59.6 in | Wt 212.0 lb

## 2019-07-09 DIAGNOSIS — I129 Hypertensive chronic kidney disease with stage 1 through stage 4 chronic kidney disease, or unspecified chronic kidney disease: Secondary | ICD-10-CM

## 2019-07-09 DIAGNOSIS — Z6841 Body Mass Index (BMI) 40.0 and over, adult: Secondary | ICD-10-CM

## 2019-07-09 DIAGNOSIS — R0982 Postnasal drip: Secondary | ICD-10-CM

## 2019-07-09 DIAGNOSIS — N181 Chronic kidney disease, stage 1: Secondary | ICD-10-CM | POA: Diagnosis not present

## 2019-07-09 DIAGNOSIS — E1122 Type 2 diabetes mellitus with diabetic chronic kidney disease: Secondary | ICD-10-CM

## 2019-07-09 NOTE — Progress Notes (Signed)
Subjective:     Patient ID: Bridget Bates , female    DOB: [birth year] , 53 y.o.   MRN: [id number]   Chief Complaint  Patient presents with  . Diabetes  . Hypertension    HPI  Diabetes She presents for her follow-up diabetic visit. She has type 2 diabetes mellitus. Her disease course has been stable. There are no hypoglycemic associated symptoms. Pertinent negatives for diabetes include no blurred vision and no chest pain. There are no hypoglycemic complications. Risk factors for coronary artery disease include diabetes mellitus, dyslipidemia, hypertension, sedentary lifestyle and obesity. She never participates in exercise. Her breakfast blood glucose is taken between 8-9 am. Her breakfast blood glucose range is generally 110-130 mg/dl. An ACE inhibitor/angiotensin II receptor blocker is being taken. Eye exam is not current.  Hypertension This is a chronic problem. The current episode started more than 1 year ago. The problem has been gradually improving since onset. The problem is uncontrolled. Pertinent negatives include no blurred vision, chest pain, palpitations or shortness of breath. The current treatment provides moderate improvement.     Past Medical History:  Diagnosis Date  . Diabetes mellitus without complication ([place])   . Hypertension      Family History  Problem Relation Age of Onset  . Diabetes Mother   . Healthy Father      Current Outpatient Medications:  .  chlorthalidone (HYGROTON) 25 MG tablet, TAKE 1 TABLET(25 MG) BY MOUTH DAILY, Disp: 90 tablet, Rfl: 1 .  metFORMIN (GLUCOPHAGE) 500 MG tablet, TAKE 1 TABLET(500 MG) BY MOUTH TWICE DAILY WITH A MEAL, Disp: 180 tablet, Rfl: 1 .  Semaglutide,0.25 or 0.5MG/DOS, (OZEMPIC, 0.25 OR 0.5 MG/DOSE,) 2 MG/1.5ML SOPN, Inject 0.5 mg into the skin once a week., Disp: 3 pen, Rfl: 1 .  simvastatin (ZOCOR) 10 MG tablet, TAKE 1 TABLET BY MOUTH EVERY DAY IN THE EVENING, Disp: 90 tablet, Rfl: 0 .  telmisartan (MICARDIS) 80 MG  tablet, TAKE 1 TABLET(80 MG) BY MOUTH DAILY, Disp: 90 tablet, Rfl: 2 .  Vitamin D, Ergocalciferol, (DRISDOL) 1.25 MG (50000 UT) CAPS capsule, Take 1 capsule (50,000 Units total) by mouth as directed. Take 1 capsule by mouth on Tuesday and Fridays., Disp: 8 capsule, Rfl: 3 .  glucose blood (ONETOUCH VERIO) test strip, 1 each by Other route as needed for other. Use as instructed, Disp: , Rfl:    Allergies  Allergen Reactions  . Amoxicillin Hives and Itching     Review of Systems  Constitutional: Negative.   HENT: Positive for postnasal drip.   Eyes: Negative for blurred vision.  Respiratory: Negative.  Negative for shortness of breath.   Cardiovascular: Negative.  Negative for chest pain and palpitations.  Gastrointestinal: Negative.   Neurological: Negative.   Psychiatric/Behavioral: Negative.      Today's Vitals   07/09/19 1158  BP: 122/76  Pulse: 94  Temp: 98.5 F (36.9 C)  TempSrc: Oral  Weight: 212 lb (96.2 kg)  Height: 4' 11.6" (1.514 m)   Body mass index is 41.96 kg/m.   Objective:  Physical Exam Vitals signs and nursing note reviewed.  Constitutional:      Appearance: Normal appearance.  HENT:     Head: Normocephalic and atraumatic.  Cardiovascular:     Rate and Rhythm: Normal rate and regular rhythm.     Heart sounds: Normal heart sounds.  Pulmonary:     Effort: Pulmonary effort is normal.     Breath sounds: Normal breath sounds.  Skin:  General: Skin is warm.  Neurological:     General: No focal deficit present.     Mental Status: She is alert.  Psychiatric:        Mood and Affect: Mood normal.        Behavior: Behavior normal.         Assessment And Plan:     1. Diabetes mellitus with stage 1 chronic kidney disease ([place])  She feels well on Ozempic. I will check labs as listed below. She is encouraged to keep up the great work. She will rto in 3 months for re-evaluation.   - BMP8+EGFR - Hemoglobin A1c  2. Hypertensive  nephropathy  Chronic, well controlled. She will continue with current meds. She is encouraged to avoid adding salt to her foods.  3. Postnasal drip  She was given samples of Zyrtec to take nightly as needed.   4. Class 3 severe obesity due to excess calories with serious comorbidity and body mass index (BMI) of 40.0 to 44.9 in adult [facility])  She was congratulated on her 9 pound weight loss and encouraged to keep up the great work.   Maximino Greenland, MD    THE PATIENT IS ENCOURAGED TO PRACTICE SOCIAL DISTANCING DUE TO THE COVID-19 PANDEMIC.

## 2019-07-09 NOTE — Patient Instructions (Signed)
Preventing Influenza, Adult Influenza, more commonly known as "the flu," is a viral infection that mainly affects the respiratory tract. The respiratory tract includes structures that help you breathe, such as the lungs, nose, and throat. The flu causes many common cold symptoms, as well as a high fever and body aches. The flu spreads easily from person to person (is contagious). The flu is most common from December through March. This is called flu season.You can catch the flu virus by:  Breathing in droplets from an infected person's cough or sneeze.  Touching something that was recently contaminated with the virus and then touching your mouth, nose, or eyes. What can I do to lower my risk?        You can decrease your risk of getting the flu by:  Getting a flu shot (influenza vaccination) every year. This is the best way to prevent the flu. A flu shot is recommended for everyone age 6 months and older. ? It is best to get a flu shot in the fall, as soon as it is available. Getting a flu shot during winter or spring instead is still a good idea. Flu season can last into early spring. ? Preventing the flu through vaccination requires getting a new flu shot every year. This is because the flu virus changes slightly (mutates) from one year to the next. Even if a flu shot does not completely protect you from all flu virus mutations, it can reduce the severity of your illness and prevent dangerous complications of the flu. ? If you are pregnant, you can and should get a flu shot. ? If you have had a reaction to the shot in the past or if you are allergic to eggs, check with your health care provider before getting a flu shot. ? Sometimes the vaccine is available as a nasal spray. In some years, the nasal spray has not been as effective against the flu virus. Check with your health care provider if you have questions about this.  Practicing good health habits. This is especially important during  flu season. ? Avoid contact with people who are sick with flu or cold symptoms. ? Wash your hands with soap and water often. If soap and water are not available, use alcohol-based hand sanitizer. ? Avoid touching your hands to your face, especially when you have not washed your hands recently. ? Use a disinfectant to clean surfaces at home and at work that may be contaminated with the flu virus. ? Keep your body's disease-fighting system (immune system) in good shape by eating a healthy diet, drinking plenty of fluids, getting enough sleep, and exercising regularly. If you do get the flu, avoid spreading it to others by:  Staying home until your symptoms have been gone for at least one day.  Covering your mouth and nose when you cough or sneeze.  Avoiding close contact with others, especially babies and elderly people. Why are these changes important? Getting a flu shot and practicing good health habits protects you as well as other people. If you get the flu, your friends, family, and co-workers are also at risk of getting it, because it spreads so easily to others. Each year, about 2 out of every 10 people get the flu. Having the flu can lead to complications, such as pneumonia, ear infection, and sinus infection. The flu also can be deadly, especially for babies, people older than age 65, and people who have serious long-term diseases. How is this treated? Most   people recover from the flu by resting at home and drinking plenty of fluids. However, a prescription antiviral medicine may reduce your flu symptoms and may make your flu go away sooner. This medicine must be started within a few days of getting flu symptoms. You can talk with your health care provider about whether you need an antiviral medicine. Antiviral medicine may be prescribed for people who are at risk for more serious flu symptoms. This includes people who:  Are older than age 65.  Are pregnant.  Have a condition that  makes the flu worse or more dangerous. Where to find more information  Centers for Disease Control and Prevention: www.cdc.gov/flu/index.htm  Flu.gov: www.flu.gov/prevention-vaccination  American Academy [employer]: familydoctor.org/familydoctor/en/kids/vaccines/preventing-the-flu.html Contact a health care provider if:  You have influenza and you develop new symptoms.  You have: ? Chest pain. ? Diarrhea. ? A fever.  Your cough gets worse, or you produce more mucus. Summary  The best way to prevent the flu is to get a flu shot every year in the fall.  Even if you get the flu after you have received the yearly vaccine, your flu may be milder and go away sooner because of your flu shot.  If you get the flu, antiviral medicines that are started with a few days of symptoms may reduce your flu symptoms and may make your flu go away sooner.  You can also help prevent the flu by practicing good health habits. This information is not intended to replace advice given to you by your health care provider. Make sure you discuss any questions you have with your health care provider. Document Released: 09/28/2015 Document Revised: 08/26/2017 Document Reviewed: 05/22/2016 Elsevier Patient Education  2020 [employer] Inc.  

## 2019-07-10 LAB — BMP8+EGFR
BUN/Creatinine Ratio: 16 (ref 9–23)
BUN: 25 mg/dL — ABNORMAL HIGH (ref 6–24)
CO2: 24 mmol/L (ref 20–29)
Calcium: 9.8 mg/dL (ref 8.7–10.2)
Chloride: 108 mmol/L — ABNORMAL HIGH (ref 96–106)
Creatinine, Ser: 1.55 mg/dL — ABNORMAL HIGH (ref 0.57–1.00)
GFR calc Af Amer: 44 mL/min/{1.73_m2} — ABNORMAL LOW (ref >59)
GFR calc non Af Amer: 38 mL/min/{1.73_m2} — ABNORMAL LOW (ref >59)
Glucose: 104 mg/dL — ABNORMAL HIGH (ref 65–99)
Potassium: 4.6 mmol/L (ref 3.5–5.2)
Sodium: 145 mmol/L — ABNORMAL HIGH (ref 134–144)

## 2019-07-10 LAB — HEMOGLOBIN A1C
Est. average glucose Bld gHb Est-mCnc: 151 mg/dL
Hgb A1c MFr Bld: 6.9 % — ABNORMAL HIGH (ref 4.8–5.6)

## 2019-07-11 ENCOUNTER — Telehealth: Payer: Self-pay

## 2019-07-11 NOTE — Telephone Encounter (Signed)
Left the patient a message to call back for lab results. 

## 2019-07-19 ENCOUNTER — Other Ambulatory Visit: Payer: Self-pay

## 2019-08-01 ENCOUNTER — Other Ambulatory Visit: Payer: Self-pay

## 2019-08-01 MED ORDER — SIMVASTATIN 10 MG PO TABS: ORAL_TABLET | ORAL | 0 refills | Status: DC

## 2019-08-06 ENCOUNTER — Other Ambulatory Visit: Payer: Self-pay

## 2019-08-06 MED ORDER — VITAMIN D (ERGOCALCIFEROL) 1.25 MG (50000 UNIT) PO CAPS: 50000.0000 [IU] | ORAL_CAPSULE | ORAL | 3 refills | Status: DC

## 2019-09-17 LAB — HM DIABETES EYE EXAM

## 2019-10-08 ENCOUNTER — Encounter: Payer: Self-pay | Admitting: Internal Medicine

## 2019-10-09 ENCOUNTER — Ambulatory Visit: Payer: Self-pay | Admitting: Internal Medicine

## 2019-10-09 ENCOUNTER — Other Ambulatory Visit: Payer: Self-pay

## 2019-12-04 ENCOUNTER — Other Ambulatory Visit: Payer: Self-pay | Admitting: Internal Medicine

## 2019-12-04 ENCOUNTER — Telehealth: Payer: Self-pay

## 2019-12-04 NOTE — Telephone Encounter (Signed)
LVM for pt to call the office to schedule an appt. °

## 2019-12-17 ENCOUNTER — Other Ambulatory Visit: Payer: Self-pay

## 2019-12-17 ENCOUNTER — Ambulatory Visit (INDEPENDENT_AMBULATORY_CARE_PROVIDER_SITE_OTHER): Payer: 59 | Admitting: Internal Medicine

## 2019-12-17 ENCOUNTER — Encounter: Payer: Self-pay | Admitting: Internal Medicine

## 2019-12-17 VITALS — BP 120/82 | HR 66 | Temp 98.4°F | Ht 59.6 in | Wt 218.0 lb

## 2019-12-17 DIAGNOSIS — I129 Hypertensive chronic kidney disease with stage 1 through stage 4 chronic kidney disease, or unspecified chronic kidney disease: Secondary | ICD-10-CM

## 2019-12-17 DIAGNOSIS — N181 Chronic kidney disease, stage 1: Secondary | ICD-10-CM | POA: Diagnosis not present

## 2019-12-17 DIAGNOSIS — E1122 Type 2 diabetes mellitus with diabetic chronic kidney disease: Secondary | ICD-10-CM

## 2019-12-17 DIAGNOSIS — Z6841 Body Mass Index (BMI) 40.0 and over, adult: Secondary | ICD-10-CM

## 2019-12-17 MED ORDER — ONETOUCH ULTRASOFT LANCETS MISC: 12 refills | Status: AC

## 2019-12-17 MED ORDER — ONETOUCH VERIO VI STRP: ORAL_STRIP | 5 refills | Status: AC

## 2019-12-17 NOTE — Progress Notes (Signed)
This visit occurred during the SARS-CoV-2 public health emergency.  Safety protocols were in place, including screening questions prior to the visit, additional usage of staff PPE, and extensive cleaning of exam room while observing appropriate contact time as indicated for disinfecting solutions.  Subjective:     Patient ID: Bridget Bates , female    DOB: 1966-01-10 , 54 y.o.   MRN: [id number]   Chief Complaint  Patient presents with  . Diabetes  . Hypertension    HPI  Diabetes She presents for her follow-up diabetic visit. She has type 2 diabetes mellitus. Her disease course has been stable. There are no hypoglycemic associated symptoms. Pertinent negatives for diabetes include no blurred vision and no chest pain. There are no hypoglycemic complications. Risk factors for coronary artery disease include diabetes mellitus, dyslipidemia, hypertension, sedentary lifestyle and obesity. She never participates in exercise. Her breakfast blood glucose is taken between 8-9 am. Her breakfast blood glucose range is generally 110-130 mg/dl. An ACE inhibitor/angiotensin II receptor blocker is being taken. Eye exam is not current.  Hypertension This is a chronic problem. The current episode started more than 1 year ago. The problem has been gradually improving since onset. The problem is uncontrolled. Pertinent negatives include no blurred vision, chest pain, palpitations or shortness of breath. The current treatment provides moderate improvement.     Past Medical History:  Diagnosis Date  . Diabetes mellitus without complication ([place])   . Hypertension   . Obesity      Family History  Problem Relation Age of Onset  . Diabetes Mother   . Healthy Father      Current Outpatient Medications:  .  chlorthalidone (HYGROTON) 25 MG tablet, TAKE 1 TABLET(25 MG) BY MOUTH DAILY (Patient taking differently: Monday, Wednesday, Friday only), Disp: 90 tablet, Rfl: 1 .  glucose blood (ONETOUCH VERIO) test  strip, 1 each by Other route as needed for other. Use as instructed, Disp: , Rfl:  .  metFORMIN (GLUCOPHAGE) 500 MG tablet, TAKE 1 TABLET(500 MG) BY MOUTH TWICE DAILY WITH A MEAL, Disp: 180 tablet, Rfl: 1 .  simvastatin (ZOCOR) 10 MG tablet, TAKE 1 TABLET BY MOUTH EVERY DAY IN THE EVENING, Disp: 90 tablet, Rfl: 0 .  telmisartan (MICARDIS) 80 MG tablet, TAKE 1 TABLET(80 MG) BY MOUTH DAILY, Disp: 90 tablet, Rfl: 2 .  Vitamin D, Ergocalciferol, (DRISDOL) 1.25 MG (50000 UT) CAPS capsule, Take 1 capsule (50,000 Units total) by mouth as directed. Take 1 capsule by mouth on Tuesday and Fridays. (Patient not taking: Reported on 12/17/2019), Disp: 8 capsule, Rfl: 3   Allergies  Allergen Reactions  . Amoxicillin Hives and Itching     Review of Systems  Constitutional: Negative.   Eyes: Negative for blurred vision.  Respiratory: Negative.  Negative for shortness of breath.   Cardiovascular: Negative.  Negative for chest pain and palpitations.  Gastrointestinal: Negative.   Neurological: Negative.   Psychiatric/Behavioral: Negative.      Today's Vitals   12/17/19 1455  BP: 120/82  Pulse: 66  Temp: 98.4 F (36.9 C)  TempSrc: Oral  SpO2: 94%  Weight: 218 lb (98.9 kg)  Height: 4' 11.6" (1.514 m)   Body mass index is 43.15 kg/m.   Wt Readings from Last 3 Encounters:  12/17/19 218 lb (98.9 kg)  07/09/19 212 lb (96.2 kg)  04/30/19 221 lb (100.2 kg)     Objective:  Physical Exam Vitals and nursing note reviewed.  Constitutional:      Appearance: Normal appearance. She  is obese.  HENT:     Head: Normocephalic and atraumatic.  Cardiovascular:     Rate and Rhythm: Normal rate and regular rhythm.     Heart sounds: Normal heart sounds.  Pulmonary:     Effort: Pulmonary effort is normal.     Breath sounds: Normal breath sounds.  Skin:    General: Skin is warm.  Neurological:     General: No focal deficit present.     Mental Status: She is alert.  Psychiatric:        Mood and  Affect: Mood normal.        Behavior: Behavior normal.         Assessment And Plan:     1. 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 She will continue with current meds.  She is encouraged to avoid sugary beverages. I will adjust meds as needed. She agrees to Rybelsus, she will start with 56m daily. She denies family and personal h/o thyroid cancer. Possible side effects were discussed with the patient.   - Hemoglobin A1c - BMP8+EGFR  2. Hypertensive nephropathy  Chronic, well controlled. She will continue with current meds. She is encouraged to avoid adding salt to her foods.   3. Class 3 severe obesity due to excess calories with serious comorbidity and body mass index (BMI) of 40.0 to 44.9 in adult ([facility]  Importance of achieving optimal weight to decrease risk of cardiovascular disease and cancers was discussed with the patient in full detail. She is encouraged to start slowly - start with 10 minutes twice daily at least three to four days per week and to gradually build to 30 minutes five days weekly. She was given tips to incorporate more activity into her daily routine - take stairs when possible, park farther away from her job, grocery stores, etc.     RMaximino Greenland MD    THE PATIENT IS ENCOURAGED TO PRACTICE SOCIAL DISTANCING DUE TO THE COVID-19 PANDEMIC.

## 2019-12-17 NOTE — Patient Instructions (Signed)

## 2019-12-18 LAB — BMP8+EGFR
BUN/Creatinine Ratio: 14 (ref 9–23)
BUN: 20 mg/dL (ref 6–24)
CO2: 24 mmol/L (ref 20–29)
Calcium: 9.1 mg/dL (ref 8.7–10.2)
Chloride: 106 mmol/L (ref 96–106)
Creatinine, Ser: 1.39 mg/dL — ABNORMAL HIGH (ref 0.57–1.00)
GFR calc Af Amer: 50 mL/min/{1.73_m2} — ABNORMAL LOW (ref >59)
GFR calc non Af Amer: 44 mL/min/{1.73_m2} — ABNORMAL LOW (ref >59)
Glucose: 93 mg/dL (ref 65–99)
Potassium: 4.4 mmol/L (ref 3.5–5.2)
Sodium: 143 mmol/L (ref 134–144)

## 2019-12-18 LAB — HEMOGLOBIN A1C
Est. average glucose Bld gHb Est-mCnc: 157 mg/dL
Hgb A1c MFr Bld: 7.1 % — ABNORMAL HIGH (ref 4.8–5.6)

## 2020-01-28 ENCOUNTER — Encounter: Payer: Self-pay | Admitting: Internal Medicine

## 2020-01-28 ENCOUNTER — Other Ambulatory Visit: Payer: Self-pay

## 2020-01-28 ENCOUNTER — Ambulatory Visit (INDEPENDENT_AMBULATORY_CARE_PROVIDER_SITE_OTHER): Payer: 59 | Admitting: Internal Medicine

## 2020-01-28 VITALS — BP 118/70 | HR 83 | Temp 98.5°F | Ht 60.2 in | Wt 212.0 lb

## 2020-01-28 DIAGNOSIS — N183 Chronic kidney disease, stage 3 unspecified: Secondary | ICD-10-CM | POA: Diagnosis not present

## 2020-01-28 DIAGNOSIS — E1122 Type 2 diabetes mellitus with diabetic chronic kidney disease: Secondary | ICD-10-CM

## 2020-01-28 DIAGNOSIS — Z6841 Body Mass Index (BMI) 40.0 and over, adult: Secondary | ICD-10-CM

## 2020-01-28 DIAGNOSIS — Z1231 Encounter for screening mammogram for malignant neoplasm of breast: Secondary | ICD-10-CM

## 2020-01-28 MED ORDER — RYBELSUS 3 MG PO TABS: 3.0000 mg | ORAL_TABLET | Freq: Every day | ORAL | 3 refills | Status: DC

## 2020-01-28 NOTE — Patient Instructions (Signed)

## 2020-01-28 NOTE — Progress Notes (Signed)
This visit occurred during the SARS-CoV-2 public health emergency.  Safety protocols were in place, including screening questions prior to the visit, additional usage of staff PPE, and extensive cleaning of exam room while observing appropriate contact time as indicated for disinfecting solutions.  Subjective:     Patient ID: Bridget Bates , female    DOB: [birth year] , 54 y.o.   MRN: [id number]   Chief Complaint  Patient presents with  . Diabetes  . Weight Check    HPI  She is here today for f/u Rybelsus. She was started this at her last visit. She reports she feels well on this medication. She did not tolerate past use of injectable GLP-1 agonists.    Past Medical History:  Diagnosis Date  . Diabetes mellitus without complication (HCC)   . Hypertension   . Obesity      Family History  Problem Relation Age of Onset  . Diabetes Mother   . Healthy Father      Current Outpatient Medications:  .  chlorthalidone (HYGROTON) 25 MG tablet, TAKE 1 TABLET(25 MG) BY MOUTH DAILY (Patient taking differently: Monday, Wednesday, Friday only), Disp: 90 tablet, Rfl: 1 .  glucose blood (ONETOUCH VERIO) test strip, Use as instructed to check blood sugars dx code- e11.65, Disp: 100 each, Rfl: 5 .  Lancets (ONETOUCH ULTRASOFT) lancets, Use as instructed, Disp: 100 each, Rfl: 12 .  metFORMIN (GLUCOPHAGE) 500 MG tablet, TAKE 1 TABLET(500 MG) BY MOUTH TWICE DAILY WITH A MEAL, Disp: 180 tablet, Rfl: 1 .  Semaglutide (RYBELSUS) 3 MG TABS, Take by mouth., Disp: , Rfl:  .  simvastatin (ZOCOR) 10 MG tablet, TAKE 1 TABLET BY MOUTH EVERY DAY IN THE EVENING, Disp: 90 tablet, Rfl: 0 .  telmisartan (MICARDIS) 80 MG tablet, TAKE 1 TABLET(80 MG) BY MOUTH DAILY, Disp: 90 tablet, Rfl: 2 .  Vitamin D, Ergocalciferol, (DRISDOL) 1.25 MG (50000 UT) CAPS capsule, Take 1 capsule (50,000 Units total) by mouth as directed. Take 1 capsule by mouth on Tuesday and Fridays., Disp: 8 capsule, Rfl: 3   Allergies  Allergen  Reactions  . Amoxicillin Hives and Itching     Review of Systems  Constitutional: Negative.   Respiratory: Negative.   Cardiovascular: Negative.   Gastrointestinal: Negative.   Neurological: Negative.   Psychiatric/Behavioral: Negative.      Today's Vitals   01/28/20 0919  BP: 118/70  Pulse: 83  Temp: 98.5 F (36.9 C)  TempSrc: Oral  Weight: 212 lb (96.2 kg)  Height: 5' 0.2" (1.529 m)   Body mass index is 41.13 kg/m.   Wt Readings from Last 3 Encounters:  01/28/20 212 lb (96.2 kg)  12/17/19 218 lb (98.9 kg)  07/09/19 212 lb (96.2 kg)     Objective:  Physical Exam Vitals and nursing note reviewed.  Constitutional:      Appearance: Normal appearance. She is obese.  HENT:     Head: Normocephalic and atraumatic.  Cardiovascular:     Rate and Rhythm: Normal rate and regular rhythm.     Heart sounds: Normal heart sounds.  Pulmonary:     Effort: Pulmonary effort is normal.     Breath sounds: Normal breath sounds.  Skin:    General: Skin is warm.  Neurological:     General: No focal deficit present.     Mental Status: She is alert.  Psychiatric:        Mood and Affect: Mood normal.        Behavior: Behavior normal.  Assessment And Plan:     1. Diabetes mellitus with stage 3 chronic kidney disease ([place])  I will recheck an a1c in six weeks at her next visit. If CKD still in stage 3 range, I will check SPEP, phosphorus level and PTH intact at that time. Rx for Rybelsus will be sent to pharmacy as well to start PA process.   2. Class 3 severe obesity due to excess calories with serious comorbidity and body mass index (BMI) of 40.0 to 44.9 in adult (HCC)  BMI 41. She was congratulated on losing six pounds since her last visit.  She is encouraged to incorporate more exercise into her daily routine, aiming for at least 30 minutes five days per week.   3. Encounter for screening mammogram for malignant neoplasm of breast  She agrees to mammogram referral.    - MM Digital Screening; Future     Maximino Greenland, MD    THE PATIENT IS ENCOURAGED TO PRACTICE SOCIAL DISTANCING DUE TO THE COVID-19 PANDEMIC.

## 2020-01-30 ENCOUNTER — Telehealth: Payer: Self-pay

## 2020-01-30 NOTE — Telephone Encounter (Signed)
The patient called and said that she got her covid vaccination shots on 11/02/2019 and 11/30/2019 and that she didn't get her Ryblesus samples at her last visit.  The pt was told that her samples are ready for pickup and was asked was it [employer] or Moderna vaccination.  The pt said that her card was at home and she will call back with the information.

## 2020-02-05 ENCOUNTER — Other Ambulatory Visit: Payer: Self-pay | Admitting: Internal Medicine

## 2020-03-03 ENCOUNTER — Other Ambulatory Visit: Payer: Self-pay | Admitting: Internal Medicine

## 2020-03-11 ENCOUNTER — Encounter: Payer: Self-pay | Admitting: Internal Medicine

## 2020-03-11 ENCOUNTER — Ambulatory Visit (INDEPENDENT_AMBULATORY_CARE_PROVIDER_SITE_OTHER): Payer: 59 | Admitting: Internal Medicine

## 2020-03-11 ENCOUNTER — Other Ambulatory Visit: Payer: Self-pay

## 2020-03-11 VITALS — BP 124/70 | HR 89 | Temp 98.6°F | Ht 60.2 in | Wt 206.8 lb

## 2020-03-11 DIAGNOSIS — I129 Hypertensive chronic kidney disease with stage 1 through stage 4 chronic kidney disease, or unspecified chronic kidney disease: Secondary | ICD-10-CM | POA: Diagnosis not present

## 2020-03-11 DIAGNOSIS — E7800 Pure hypercholesterolemia, unspecified: Secondary | ICD-10-CM

## 2020-03-11 DIAGNOSIS — N183 Chronic kidney disease, stage 3 unspecified: Secondary | ICD-10-CM

## 2020-03-11 DIAGNOSIS — Z6841 Body Mass Index (BMI) 40.0 and over, adult: Secondary | ICD-10-CM

## 2020-03-11 DIAGNOSIS — E1122 Type 2 diabetes mellitus with diabetic chronic kidney disease: Secondary | ICD-10-CM

## 2020-03-11 DIAGNOSIS — Z1159 Encounter for screening for other viral diseases: Secondary | ICD-10-CM

## 2020-03-11 NOTE — Patient Instructions (Signed)

## 2020-03-11 NOTE — Progress Notes (Signed)
This visit occurred during the SARS-CoV-2 public health emergency.  Safety protocols were in place, including screening questions prior to the visit, additional usage of staff PPE, and extensive cleaning of exam room while observing appropriate contact time as indicated for disinfecting solutions.  Subjective:     Patient ID: Bridget Bates , female    DOB: Dec 14, 1965 , 54 y.o.   MRN: [id number]   Chief Complaint  Patient presents with  . Diabetes  . Hypertension    HPI  She is here today for DM/HTN check. She is excited to report that she is now walking three days per week. She has been feeling pretty good.   Diabetes She presents for her follow-up diabetic visit. She has type 2 diabetes mellitus. Her disease course has been stable. There are no hypoglycemic associated symptoms. Pertinent negatives for diabetes include no blurred vision and no chest pain. There are no hypoglycemic complications. Risk factors for coronary artery disease include diabetes mellitus, dyslipidemia, hypertension, sedentary lifestyle and obesity. She never participates in exercise. Her breakfast blood glucose is taken between 8-9 am. Her breakfast blood glucose range is generally 110-130 mg/dl. An ACE inhibitor/angiotensin II receptor blocker is being taken. Eye exam is not current.  Hypertension This is a chronic problem. The current episode started more than 1 year ago. The problem has been gradually improving since onset. The problem is uncontrolled. Pertinent negatives include no blurred vision, chest pain, palpitations or shortness of breath. The current treatment provides moderate improvement.     Past Medical History:  Diagnosis Date  . Diabetes mellitus without complication ([place])   . Hypertension   . Obesity      Family History  Problem Relation Age of Onset  . Diabetes Mother   . Healthy Father      Current Outpatient Medications:  .  chlorthalidone (HYGROTON) 25 MG tablet, Monday, Wednesday,  Friday only, Disp: 90 tablet, Rfl: 1 .  metFORMIN (GLUCOPHAGE) 500 MG tablet, TAKE 1 TABLET(500 MG) BY MOUTH TWICE DAILY WITH A MEAL, Disp: 180 tablet, Rfl: 1 .  Semaglutide (RYBELSUS) 3 MG TABS, Take 3 mg by mouth daily., Disp: 30 tablet, Rfl: 3 .  simvastatin (ZOCOR) 10 MG tablet, TAKE 1 TABLET BY MOUTH EVERY DAY IN THE EVENING, Disp: 90 tablet, Rfl: 0 .  telmisartan (MICARDIS) 80 MG tablet, TAKE 1 TABLET(80 MG) BY MOUTH DAILY, Disp: 90 tablet, Rfl: 2 .  glucose blood (ONETOUCH VERIO) test strip, Use as instructed to check blood sugars dx code- e11.65 (Patient not taking: Reported on 03/10/2020), Disp: 100 each, Rfl: 5 .  Lancets (ONETOUCH ULTRASOFT) lancets, Use as instructed (Patient not taking: Reported on 03/10/2020), Disp: 100 each, Rfl: 12   Allergies  Allergen Reactions  . Amoxicillin Hives and Itching     Review of Systems  Constitutional: Negative.   Eyes: Negative for blurred vision.  Respiratory: Negative.  Negative for shortness of breath.   Cardiovascular: Negative.  Negative for chest pain and palpitations.  Gastrointestinal: Negative.   Neurological: Negative.   Psychiatric/Behavioral: Negative.      Today's Vitals   03/11/20 1455  BP: 124/70  Pulse: 89  Temp: 98.6 F (37 C)  TempSrc: Oral  Weight: 206 lb 12.8 oz (93.8 kg)  Height: 5' 0.2" (1.529 m)   Body mass index is 40.12 kg/m.   Objective:  Physical Exam Vitals and nursing note reviewed.  Constitutional:      Appearance: Normal appearance. She is obese.  HENT:     Head:  Normocephalic and atraumatic.  Cardiovascular:     Rate and Rhythm: Normal rate and regular rhythm.     Heart sounds: Normal heart sounds.  Pulmonary:     Effort: Pulmonary effort is normal.     Breath sounds: Normal breath sounds.  Skin:    General: Skin is warm.  Neurological:     General: No focal deficit present.     Mental Status: She is alert.  Psychiatric:        Mood and Affect: Mood normal.        Behavior: Behavior  normal.         Assessment And Plan:     1. Diabetes mellitus with stage 3 chronic kidney disease (HCC)  Chronic, I will check labs as listed below. She is doing well on her current regimen. She was congratulated on her lifestyle changes.  - CMP14+EGFR - Lipid panel - Hemoglobin A1c  2. Hypertensive nephropathy  Chronic, well controlled.  She will continue with current meds. She is encouraged to avoid adding salt to her foods.   3. Pure hypercholesterolemia  Chronic. I will check non-fasting lipid panel and LFTs today. She is also encouraged to continue with her exercise regimen and to avoid fried foods.   4. Class 3 severe obesity due to excess calories with serious comorbidity and body mass index (BMI) of 40.0 to 44.9 in adult [facility])  She was congratulated on her 6 pound weight loss since her last visit. She is currently exercising 3 days per week and is advised to increase to 5 days per week. She was congratulated on the lifestyle changes she has made thus far.   5. Need for hepatitis C screening test  I will check HCV antibody today.   - Hepatitis C antibody   Maximino Greenland, MD    THE PATIENT IS ENCOURAGED TO PRACTICE SOCIAL DISTANCING DUE TO THE COVID-19 PANDEMIC.

## 2020-03-12 LAB — INSULIN, RANDOM: INSULIN: 14.1 u[IU]/mL (ref 2.6–24.9)

## 2020-03-12 LAB — LIPID PANEL
Chol/HDL Ratio: 3.2 ratio (ref 0.0–4.4)
Cholesterol, Total: 137 mg/dL (ref 100–199)
HDL: 43 mg/dL (ref >39)
LDL Chol Calc (NIH): 73 mg/dL (ref 0–99)
Triglycerides: 117 mg/dL (ref 0–149)
VLDL Cholesterol Cal: 21 mg/dL (ref 5–40)

## 2020-03-12 LAB — CMP14+EGFR
ALT: 4 IU/L (ref 0–32)
AST: 10 IU/L (ref 0–40)
Albumin/Globulin Ratio: 1.2 (ref 1.2–2.2)
Albumin: 3.9 g/dL (ref 3.8–4.9)
Alkaline Phosphatase: 78 IU/L (ref 48–121)
BUN/Creatinine Ratio: 16 (ref 9–23)
BUN: 20 mg/dL (ref 6–24)
Bilirubin Total: 0.4 mg/dL (ref 0.0–1.2)
CO2: 23 mmol/L (ref 20–29)
Calcium: 9.3 mg/dL (ref 8.7–10.2)
Chloride: 104 mmol/L (ref 96–106)
Creatinine, Ser: 1.25 mg/dL — ABNORMAL HIGH (ref 0.57–1.00)
GFR calc Af Amer: 57 mL/min/{1.73_m2} — ABNORMAL LOW (ref >59)
GFR calc non Af Amer: 50 mL/min/{1.73_m2} — ABNORMAL LOW (ref >59)
Globulin, Total: 3.3 g/dL (ref 1.5–4.5)
Glucose: 85 mg/dL (ref 65–99)
Potassium: 3.8 mmol/L (ref 3.5–5.2)
Sodium: 140 mmol/L (ref 134–144)
Total Protein: 7.2 g/dL (ref 6.0–8.5)

## 2020-03-12 LAB — HEMOGLOBIN A1C
Est. average glucose Bld gHb Est-mCnc: 128 mg/dL
Hgb A1c MFr Bld: 6.1 % — ABNORMAL HIGH (ref 4.8–5.6)

## 2020-03-12 LAB — HEPATITIS C ANTIBODY: Hep C Virus Ab: <0.1 s/co ratio (ref 0.0–0.9)

## 2020-03-13 ENCOUNTER — Telehealth: Payer: Self-pay

## 2020-03-13 NOTE — Telephone Encounter (Signed)
The pt was told that the office received an alert that the pt may not be compliant with her Ryblesus because her last fill date was 01/28/2020.  The pt said that she got samples from the office last month and that maybe why the office got the alert because she takes her medications every day.

## 2020-04-08 ENCOUNTER — Encounter: Payer: BC Managed Care – PPO | Admitting: Internal Medicine

## 2020-05-13 ENCOUNTER — Other Ambulatory Visit: Payer: Self-pay | Admitting: Internal Medicine

## 2020-06-02 ENCOUNTER — Other Ambulatory Visit: Payer: Self-pay | Admitting: Internal Medicine

## 2020-06-06 ENCOUNTER — Other Ambulatory Visit: Payer: Self-pay | Admitting: Internal Medicine

## 2020-06-09 ENCOUNTER — Ambulatory Visit (INDEPENDENT_AMBULATORY_CARE_PROVIDER_SITE_OTHER): Payer: 59 | Admitting: Internal Medicine

## 2020-06-09 ENCOUNTER — Other Ambulatory Visit: Payer: Self-pay

## 2020-06-09 ENCOUNTER — Other Ambulatory Visit: Payer: Self-pay | Admitting: Internal Medicine

## 2020-06-09 ENCOUNTER — Encounter: Payer: Self-pay | Admitting: Internal Medicine

## 2020-06-09 VITALS — BP 126/78 | HR 93 | Temp 98.1°F | Ht 60.6 in | Wt 204.4 lb

## 2020-06-09 DIAGNOSIS — I129 Hypertensive chronic kidney disease with stage 1 through stage 4 chronic kidney disease, or unspecified chronic kidney disease: Secondary | ICD-10-CM

## 2020-06-09 DIAGNOSIS — Z0000 Encounter for general adult medical examination without abnormal findings: Secondary | ICD-10-CM | POA: Diagnosis not present

## 2020-06-09 DIAGNOSIS — E1121 Type 2 diabetes mellitus with diabetic nephropathy: Secondary | ICD-10-CM | POA: Diagnosis not present

## 2020-06-09 DIAGNOSIS — E559 Vitamin D deficiency, unspecified: Secondary | ICD-10-CM

## 2020-06-09 DIAGNOSIS — E1122 Type 2 diabetes mellitus with diabetic chronic kidney disease: Secondary | ICD-10-CM

## 2020-06-09 DIAGNOSIS — Z6839 Body mass index (BMI) 39.0-39.9, adult: Secondary | ICD-10-CM

## 2020-06-09 DIAGNOSIS — N1831 Chronic kidney disease, stage 3a: Secondary | ICD-10-CM

## 2020-06-09 LAB — POCT URINALYSIS DIPSTICK
Bilirubin, UA: NEGATIVE
Blood, UA: NEGATIVE
Glucose, UA: NEGATIVE
Ketones, UA: NEGATIVE
Nitrite, UA: NEGATIVE
Protein, UA: NEGATIVE
Spec Grav, UA: 1.02 (ref 1.010–1.025)
Urobilinogen, UA: 0.2 E.U./dL
pH, UA: 5.5 (ref 5.0–8.0)

## 2020-06-09 LAB — POCT UA - MICROALBUMIN
Albumin/Creatinine Ratio, Urine, POC: <30
Creatinine, POC: 100 mg/dL
Microalbumin Ur, POC: 10 mg/L

## 2020-06-09 MED ORDER — RYBELSUS 7 MG PO TABS: 7.0000 mg | ORAL_TABLET | Freq: Every day | ORAL | 2 refills | Status: DC

## 2020-06-09 NOTE — Patient Instructions (Addendum)
Rybelsus, 7mg   Daily x one month, then Rybelsus 7mg  PLUS 3mg  daily x one month then Call Dr. for new rx for 14mg  dose  Health Maintenance, Female Adopting a healthy lifestyle and getting preventive care are important in promoting health and wellness. Ask your health care provider about:  The right schedule for you to have regular tests and exams.  Things you can do on your own to prevent diseases and keep yourself healthy. What should I know about diet, weight, and exercise? Eat a healthy diet   Eat a diet that includes plenty of vegetables, fruits, low-fat dairy products, and lean protein.  Do not eat a lot of foods that are high in solid fats, added sugars, or sodium. Maintain a healthy weight Body mass index (BMI) is used to identify weight problems. It estimates body fat based on height and weight. Your health care provider can help determine your BMI and help you achieve or maintain a healthy weight. Get regular exercise Get regular exercise. This is one of the most important things you can do for your health. Most adults should:  Exercise for at least 150 minutes each week. The exercise should increase your heart rate and make you sweat (moderate-intensity exercise).  Do strengthening exercises at least twice a week. This is in addition to the moderate-intensity exercise.  Spend less time sitting. Even light physical activity can be beneficial. Watch cholesterol and blood lipids Have your blood tested for lipids and cholesterol at 53 years of age, then have this test every 5 years. Have your cholesterol levels checked more often if:  Your lipid or cholesterol levels are high.  You are older than 53 years of age.  You are at high risk for heart disease. What should I know about cancer screening? Depending on your health history and family history, you may need to have cancer screening at various ages. This may include screening for:  Breast cancer.  Cervical  cancer.  Colorectal cancer.  Skin cancer.  Lung cancer. What should I know about heart disease, diabetes, and high blood pressure? Blood pressure and heart disease  High blood pressure causes heart disease and increases the risk of stroke. This is more likely to develop in people who have high blood pressure readings, are of African descent, or are overweight.  Have your blood pressure checked: ? Every 3-5 years if you are 43-12 years of age. ? Every year if you are 75 years old or older. Diabetes Have regular diabetes screenings. This checks your fasting blood sugar level. Have the screening done:  Once every three years after age 68 if you are at a normal weight and have a low risk for diabetes.  More often and at a younger age if you are overweight or have a high risk for diabetes. What should I know about preventing infection? Hepatitis B If you have a higher risk for hepatitis B, you should be screened for this virus. Talk with your health care provider to find out if you are at risk for hepatitis B infection. Hepatitis C Testing is recommended for:  Everyone born from [birth year] through 1964.  Anyone with known risk factors for hepatitis C. Sexually transmitted infections (STIs)  Get screened for STIs, including gonorrhea and chlamydia, if: ? You are sexually active and are younger than 53 years of age. ? You are older than 53 years of age and your health care provider tells you that you are at risk for this type of infection. ?  Your sexual activity has changed since you were last screened, and you are at increased risk for chlamydia or gonorrhea. Ask your health care provider if you are at risk.  Ask your health care provider about whether you are at high risk for HIV. Your health care provider may recommend a prescription medicine to help prevent HIV infection. If you choose to take medicine to prevent HIV, you should first get tested for HIV. You should then be tested every 3  months for as long as you are taking the medicine. Pregnancy  If you are about to stop having your period (premenopausal) and you may become pregnant, seek counseling before you get pregnant.  Take 400 to 800 micrograms (mcg) of folic acid every day if you become pregnant.  Ask for birth control (contraception) if you want to prevent pregnancy. Osteoporosis and menopause Osteoporosis is a disease in which the bones lose minerals and strength with aging. This can result in bone fractures. If you are 30 years old or older, or if you are at risk for osteoporosis and fractures, ask your health care provider if you should:  Be screened for bone loss.  Take a calcium or vitamin D supplement to lower your risk of fractures.  Be given hormone replacement therapy (HRT) to treat symptoms of menopause. Follow these instructions at home: Lifestyle  Do not use any products that contain nicotine or tobacco, such as cigarettes, e-cigarettes, and chewing tobacco. If you need help quitting, ask your health care provider.  Do not use street drugs.  Do not share needles.  Ask your health care provider for help if you need support or information about quitting drugs. Alcohol use  Do not drink alcohol if: ? Your health care provider tells you not to drink. ? You are pregnant, may be pregnant, or are planning to become pregnant.  If you drink alcohol: ? Limit how much you use to 0-1 drink a day. ? Limit intake if you are breastfeeding.  Be aware of how much alcohol is in your drink. In the [place] one drink equals one 12 oz bottle of beer (355 mL), one 5 oz glass of wine (148 mL), or one 1 oz glass of hard liquor (44 mL). General instructions  Schedule regular health, dental, and eye exams.  Stay current with your vaccines.  Tell your health care provider if: ? You often feel depressed. ? You have ever been abused or do not feel safe at home. Summary  Adopting a healthy lifestyle and getting  preventive care are important in promoting health and wellness.  Follow your health care provider's instructions about healthy diet, exercising, and getting tested or screened for diseases.  Follow your health care provider's instructions on monitoring your cholesterol and blood pressure. This information is not intended to replace advice given to you by your health care provider. Make sure you discuss any questions you have with your health care provider. Document Revised: 09/06/2018 Document Reviewed: 09/06/2018 Elsevier Patient Education  2020 [employer].

## 2020-06-09 NOTE — Progress Notes (Signed)
I,Tianna Badgett,acting as a scribe for Robyn N Sanders, MD.,have documented all relevant documentation on the behalf of Robyn N Sanders, MD,as directed by  Robyn N Sanders, MD while in the presence of Robyn N Sanders, MD.  This visit occurred during the SARS-CoV-2 public health emergency.  Safety protocols were in place, including screening questions prior to the visit, additional usage of staff PPE, and extensive cleaning of exam room while observing appropriate contact time as indicated for disinfecting solutions.  Subjective:     Patient ID: Bridget Bates , female    DOB: 08/18/1966 , 53 y.o.   MRN: [id number]   Chief Complaint  Patient presents with  . Annual Exam  . Diabetes  . Hypertension    HPI  She is here today for a full physical examination. She had her last pap smear in 2019.   Diabetes She presents for her follow-up diabetic visit. She has type 2 diabetes mellitus. Her disease course has been stable. There are no hypoglycemic associated symptoms. Pertinent negatives for diabetes include no blurred vision and no chest pain. There are no hypoglycemic complications. Risk factors for coronary artery disease include diabetes mellitus, dyslipidemia, hypertension, sedentary lifestyle and obesity. She never participates in exercise. Her breakfast blood glucose is taken between 8-9 am. Her breakfast blood glucose range is generally 110-130 mg/dl. An ACE inhibitor/angiotensin II receptor blocker is being taken. Eye exam is not current.  Hypertension This is a chronic problem. The current episode started more than 1 year ago. The problem has been gradually improving since onset. The problem is uncontrolled. Pertinent negatives include no blurred vision, chest pain, palpitations or shortness of breath.     Past Medical History:  Diagnosis Date  . Diabetes mellitus without complication (HCC)   . Hypertension   . Obesity      Family History  Problem Relation Age of Onset  . Diabetes  Mother   . Healthy Father      Current Outpatient Medications:  .  chlorthalidone (HYGROTON) 25 MG tablet, Monday, Wednesday, Friday only, Disp: 90 tablet, Rfl: 1 .  glucose blood (ONETOUCH VERIO) test strip, Use as instructed to check blood sugars dx code- e11.65, Disp: 100 each, Rfl: 5 .  Lancets (ONETOUCH ULTRASOFT) lancets, Use as instructed, Disp: 100 each, Rfl: 12 .  metFORMIN (GLUCOPHAGE) 500 MG tablet, TAKE 1 TABLET(500 MG) BY MOUTH TWICE DAILY WITH A MEAL, Disp: 180 tablet, Rfl: 1 .  simvastatin (ZOCOR) 10 MG tablet, TAKE 1 TABLET BY MOUTH EVERY DAY IN THE EVENING, Disp: 90 tablet, Rfl: 0 .  telmisartan (MICARDIS) 80 MG tablet, TAKE 1 TABLET(80 MG) BY MOUTH DAILY, Disp: 90 tablet, Rfl: 2 .  Semaglutide (RYBELSUS) 7 MG TABS, Take 7 mg by mouth daily., Disp: 30 tablet, Rfl: 2   Allergies  Allergen Reactions  . Amoxicillin Hives and Itching      The patient states she uses none for birth control. Last LMP was No LMP recorded. (Menstrual status: Perimenopausal).. Negative for Dysmenorrhea Negative for: breast discharge, breast lump(s), breast pain and breast self exam. Associated symptoms include abnormal vaginal bleeding. Pertinent negatives include abnormal bleeding (hematology), anxiety, decreased libido, depression, difficulty falling sleep, dyspareunia, history of infertility, nocturia, sexual dysfunction, sleep disturbances, urinary incontinence, urinary urgency, vaginal discharge and vaginal itching. Diet regular.The patient states her exercise level is  intermittent.   . The patient's tobacco use is:  Social History   Tobacco Use  Smoking Status Never Smoker  Smokeless Tobacco Never Used  .   She has been exposed to passive smoke. The patient's alcohol use is:  Social History   Substance and Sexual Activity  Alcohol Use No    Review of Systems  Constitutional: Negative.   HENT: Negative.   Eyes: Negative.  Negative for blurred vision.  Respiratory: Negative.  Negative  for shortness of breath.   Cardiovascular: Negative.  Negative for chest pain and palpitations.  Gastrointestinal: Negative.   Endocrine: Negative.   Genitourinary: Negative.   Musculoskeletal: Negative.   Skin: Negative.   Allergic/Immunologic: Negative.   Neurological: Negative.   Hematological: Negative.   Psychiatric/Behavioral: Negative.      Today's Vitals   06/09/20 1504  BP: 126/78  Pulse: 93  Temp: 98.1 F (36.7 C)  TempSrc: Oral  Weight: 204 lb 6.4 oz (92.7 kg)  Height: 5' 0.6" (1.539 m)  PainSc: 0-No pain   Body mass index is 39.13 kg/m.   Objective:  Physical Exam Vitals and nursing note reviewed.  Constitutional:      Appearance: Normal appearance. She is obese.  HENT:     Head: Normocephalic and atraumatic.     Right Ear: Tympanic membrane, ear canal and external ear normal.     Left Ear: Tympanic membrane, ear canal and external ear normal.     Nose:     Comments: Deferred, masked    Mouth/Throat:     Comments: Deferred, masked Eyes:     Extraocular Movements: Extraocular movements intact.     Conjunctiva/sclera: Conjunctivae normal.     Pupils: Pupils are equal, round, and reactive to light.  Cardiovascular:     Rate and Rhythm: Normal rate and regular rhythm.     Pulses:          Dorsalis pedis pulses are 3+ on the right side and 3+ on the left side.     Heart sounds: Normal heart sounds.  Pulmonary:     Effort: Pulmonary effort is normal.     Breath sounds: Normal breath sounds.  Chest:     Breasts: Tanner Score is 5.        Right: Normal.        Left: Normal.  Abdominal:     General: Abdomen is flat. Bowel sounds are normal.     Palpations: Abdomen is soft.  Genitourinary:    Comments: deferred Musculoskeletal:        General: Normal range of motion.     Cervical back: Normal range of motion and neck supple.  Feet:     Right foot:     Protective Sensation: 5 sites tested. 5 sites sensed.     Skin integrity: Callus and dry skin  present.     Toenail Condition: Right toenails are normal.     Left foot:     Protective Sensation: 5 sites tested. 5 sites sensed.     Skin integrity: Callus and dry skin present.     Toenail Condition: Left toenails are normal.  Skin:    General: Skin is warm and dry.  Neurological:     General: No focal deficit present.     Mental Status: She is alert and oriented to person, place, and time.  Psychiatric:        Mood and Affect: Mood normal.        Behavior: Behavior normal.         Assessment And Plan:    1. Routine general medical examination at health care facility Comments: A full exam was performed. Importance of  monthly self breast exams was discussed with the patient. Encouraged to schedule mammogram. PATIENT IS ADVISED TO GET 30-45 MINUTES REGULAR EXERCISE NO LESS THAN FOUR TO FIVE DAYS PER WEEK - BOTH WEIGHTBEARING EXERCISES AND AEROBIC ARE RECOMMENDED.  PATIENT IS ADVISED TO FOLLOW A HEALTHY DIET WITH AT LEAST SIX FRUITS/VEGGIES PER DAY, DECREASE INTAKE OF RED MEAT, AND TO INCREASE FISH INTAKE TO TWO DAYS PER WEEK.  MEATS/FISH SHOULD NOT BE FRIED, BAKED OR BROILED IS PREFERABLE.  I SUGGEST WEARING SPF 50 SUNSCREEN ON EXPOSED PARTS AND ESPECIALLY WHEN IN THE DIRECT SUNLIGHT FOR AN EXTENDED PERIOD OF TIME.  PLEASE AVOID FAST FOOD RESTAURANTS AND INCREASE YOUR WATER INTAKE.  - BMP8+EGFR  2. Type 2 diabetes mellitus with stage 3a chronic kidney disease, without long-term current use of insulin (HCC) Comments: Diabetic foot exam was performed. I will increase her Rybelsus to 7mg once daily. After four weeks, she will transition to 10mg daily x 4 weeks, then to highest dose 14mg daily.  I DISCUSSED WITH THE PATIENT AT LENGTH REGARDING THE GOALS OF GLYCEMIC CONTROL AND POSSIBLE LONG-TERM COMPLICATIONS.  I  ALSO STRESSED THE IMPORTANCE OF COMPLIANCE WITH HOME GLUCOSE MONITORING, DIETARY RESTRICTIONS INCLUDING AVOIDANCE OF SUGARY DRINKS/PROCESSED FOODS,  ALONG WITH REGULAR EXERCISE.  I   ALSO STRESSED THE IMPORTANCE OF ANNUAL EYE EXAMS, SELF FOOT CARE AND COMPLIANCE WITH OFFICE VISITS.  - CBC - Hemoglobin A1c - POCT UA - Microalbumin - POCT urinalysis dipstick - EKG 12-Lead  3. Hypertensive nephropathy Comments: Chronic, well controlled. She will continue with current meds. She is encouraged to avoid adding salt to her foods. EKG performed, NSR w/o acute changes.   4. Vitamin D deficiency Comments: I will check vitamin D level and supplement as needed. - VITAMIN D 25 Hydroxy (Vit-D Deficiency, Fractures)  5. Class 2 severe obesity due to excess calories with serious comorbidity and body mass index (BMI) of 39.0 to 39.9 in adult (HCC) Comments: She has lost 2 pounds since last visit. Encouraged to start walking 30 minutes five days per week. Pt reminded that exercise is not only important for weight loss, but also for her cardiovascular health.  Wt Readings from Last 3 Encounters:  06/09/20 204 lb 6.4 oz (92.7 kg)  03/11/20 206 lb 12.8 oz (93.8 kg)  01/28/20 212 lb (96.2 kg)       Patient was given opportunity to ask questions. Patient verbalized understanding of the plan and was able to repeat key elements of the plan. All questions were answered to their satisfaction.   Robyn N Sanders, MD   I, Robyn N Sanders, MD, have reviewed all documentation for this visit. The documentation on 06/09/20 for the exam, diagnosis, procedures, and orders are all accurate and complete.  THE PATIENT IS ENCOURAGED TO PRACTICE SOCIAL DISTANCING DUE TO THE COVID-19 PANDEMIC.   

## 2020-06-10 LAB — BMP8+EGFR
BUN/Creatinine Ratio: 17 (ref 9–23)
BUN: 18 mg/dL (ref 6–24)
CO2: 25 mmol/L (ref 20–29)
Calcium: 9 mg/dL (ref 8.7–10.2)
Chloride: 104 mmol/L (ref 96–106)
Creatinine, Ser: 1.03 mg/dL — ABNORMAL HIGH (ref 0.57–1.00)
GFR calc Af Amer: 72 mL/min/{1.73_m2} (ref >59)
GFR calc non Af Amer: 63 mL/min/{1.73_m2} (ref >59)
Glucose: 93 mg/dL (ref 65–99)
Potassium: 3.8 mmol/L (ref 3.5–5.2)
Sodium: 139 mmol/L (ref 134–144)

## 2020-06-10 LAB — CBC
Hematocrit: 32.6 % — ABNORMAL LOW (ref 34.0–46.6)
Hemoglobin: 10 g/dL — ABNORMAL LOW (ref 11.1–15.9)
MCH: 24.3 pg — ABNORMAL LOW (ref 26.6–33.0)
MCHC: 30.7 g/dL — ABNORMAL LOW (ref 31.5–35.7)
MCV: 79 fL (ref 79–97)
Platelets: 241 10*3/uL (ref 150–450)
RBC: 4.11 x10E6/uL (ref 3.77–5.28)
RDW: 14.6 % (ref 11.7–15.4)
WBC: 5.1 10*3/uL (ref 3.4–10.8)

## 2020-06-10 LAB — HEMOGLOBIN A1C
Est. average glucose Bld gHb Est-mCnc: 126 mg/dL
Hgb A1c MFr Bld: 6 % — ABNORMAL HIGH (ref 4.8–5.6)

## 2020-06-10 LAB — VITAMIN D 25 HYDROXY (VIT D DEFICIENCY, FRACTURES): Vit D, 25-Hydroxy: 28.7 ng/mL — ABNORMAL LOW (ref 30.0–100.0)

## 2020-08-28 ENCOUNTER — Other Ambulatory Visit: Payer: Self-pay | Admitting: Internal Medicine

## 2020-08-31 ENCOUNTER — Other Ambulatory Visit: Payer: Self-pay | Admitting: Internal Medicine

## 2020-09-01 ENCOUNTER — Other Ambulatory Visit: Payer: Self-pay

## 2020-09-01 MED ORDER — RYBELSUS 14 MG PO TABS: 1.0000 | ORAL_TABLET | Freq: Every day | ORAL | 2 refills | Status: DC

## 2020-10-06 ENCOUNTER — Other Ambulatory Visit: Payer: Self-pay

## 2020-10-06 ENCOUNTER — Encounter (HOSPITAL_COMMUNITY): Payer: Self-pay

## 2020-10-06 ENCOUNTER — Ambulatory Visit (HOSPITAL_COMMUNITY)
Admission: EM | Admit: 2020-10-06 | Discharge: 2020-10-06 | Disposition: A | Discharge status: Home / Self Care | Payer: 59 | Attending: Student | Admitting: Student

## 2020-10-06 DIAGNOSIS — T148XXA Other injury of unspecified body region, initial encounter: Secondary | ICD-10-CM

## 2020-10-06 MED ORDER — METAXALONE 800 MG PO TABS: 800.0000 mg | ORAL_TABLET | Freq: Three times a day (TID) | ORAL | 0 refills | Status: DC

## 2020-10-06 NOTE — ED Provider Notes (Signed)
[facility]    CSN: [id number] Arrival date & time: 10/06/20  1657      History   Chief Complaint Chief Complaint  Patient presents with  . Chest Pain    Under right breast x 3 days    HPI Bridget Bates is a 55 y.o. female presenting with pain under right breast x3 days. History of diabetes, hypertension, obesity. States pain radiates to right flank sometimes. Feels worse with deep breaths. Denies pain at rest. Denies trauma but does have active job and just got a new bed that she has trouble climbing into. Denies hematuria, dysuria, frequency, urgency, n/v/d/abd pain, fevers/chills, abdnormal vaginal discharge.  Denies left arm pain, left-sided chest pain.    HPI  Past Medical History:  Diagnosis Date  . Diabetes mellitus without complication (HCC)   . Hypertension   . Obesity     Patient Active Problem List   Diagnosis Date Noted  . Hypertensive nephropathy 04/02/2019  . Diabetes mellitus with stage 1 chronic kidney disease (HCC) 04/02/2019    Past Surgical History:  Procedure Laterality Date  . TUBAL LIGATION  1989    OB History   No obstetric history on file.      Home Medications    Prior to Admission medications   Medication Sig Start Date End Date Taking? Authorizing Provider  chlorthalidone (HYGROTON) 25 MG tablet Monday, Wednesday, Friday only 02/06/20  Yes Dorothyann Peng, MD  glucose blood [facility] VERIO) test strip Use as instructed to check blood sugars dx code- e11.65 12/17/19  Yes Dorothyann Peng, MD  Lancets [facility] ULTRASOFT) lancets Use as instructed 12/17/19  Yes Dorothyann Peng, MD  metaxalone (SKELAXIN) 800 MG tablet Take 1 tablet (800 mg total) by mouth 3 (three) times daily. 10/06/20  Yes Rhys Martini, PA-C  metFORMIN (GLUCOPHAGE) 500 MG tablet TAKE 1 TABLET(500 MG) BY MOUTH TWICE DAILY WITH A MEAL 06/06/20  Yes Dorothyann Peng, MD  Semaglutide (RYBELSUS) 14 MG TABS Take 1 tablet by mouth daily. 09/01/20  Yes Dorothyann Peng, MD   simvastatin (ZOCOR) 10 MG tablet TAKE 1 TABLET BY MOUTH EVERY DAY IN THE EVENING 08/28/20  Yes Dorothyann Peng, MD  telmisartan (MICARDIS) 80 MG tablet TAKE 1 TABLET(80 MG) BY MOUTH DAILY 03/03/20  Yes Dorothyann Peng, MD    Family History Family History  Problem Relation Age of Onset  . Diabetes Mother   . Healthy Father     Social History Social History   Tobacco Use  . Smoking status: Never Smoker  . Smokeless tobacco: Never Used  Vaping Use  . Vaping Use: Never used  Substance Use Topics  . Alcohol use: No  . Drug use: No     Allergies   Amoxicillin   Review of Systems Review of Systems  Musculoskeletal:       Right side pain   All other systems reviewed and are negative.    Physical Exam Triage Vital Signs ED Triage Vitals  Enc Vitals Group     BP 10/06/20 1809 108/77     Pulse Rate 10/06/20 1809 90     Resp 10/06/20 1809 18     Temp 10/06/20 1809 98.2 F (36.8 C)     Temp Source 10/06/20 1809 Oral     SpO2 10/06/20 1809 100 %     Weight --      Height --      Head Circumference --      Peak Flow --  Pain Score 10/06/20 1813 5     Pain Loc --      Pain Edu? --      Excl. in GC? --    No data found.  Updated Vital Signs BP 108/77 (BP Location: Right Arm)   Pulse 90   Temp 98.2 F (36.8 C) (Oral)   Resp 18   SpO2 100%   Visual Acuity Right Eye Distance:   Left Eye Distance:   Bilateral Distance:    Right Eye Near:   Left Eye Near:    Bilateral Near:     Physical Exam Vitals reviewed.  Constitutional:      General: She is not in acute distress.    Appearance: Normal appearance. She is not ill-appearing.  HENT:     Head: Normocephalic and atraumatic.  Cardiovascular:     Rate and Rhythm: Normal rate and regular rhythm.     Heart sounds: Normal heart sounds.  Pulmonary:     Effort: Pulmonary effort is normal.     Breath sounds: Normal breath sounds. No wheezing, rhonchi or rales.  Abdominal:     General: Bowel sounds are  normal. There is no distension.     Palpations: Abdomen is soft. There is no mass.     Tenderness: There is no abdominal tenderness. There is no right CVA tenderness, left CVA tenderness, guarding or rebound. Negative signs include Murphy's sign, Rovsing's sign and McBurney's sign.     Comments: Bowel sounds positive in all 4 quadrants. No tenderness to palpation. Negative Murphy Sign, Rovsing's sign, McBurney point tenderness.   Musculoskeletal:     Comments: Pain to deep palpation under R breast and R side   No spinous or paraspinous muscle pain to palpation   Neurological:     General: No focal deficit present.     Mental Status: She is alert and oriented to person, place, and time.  Psychiatric:        Mood and Affect: Mood normal.        Behavior: Behavior normal.      UC Treatments / Results  Labs (all labs ordered are listed, but only abnormal results are displayed) Labs Reviewed - No data to display  EKG   Radiology No results found.  Procedures Procedures (including critical care time)  Medications Ordered in UC Medications - No data to display  Initial Impression / Assessment and Plan / UC Course  I have reviewed the triage vital signs and the nursing notes.  Pertinent labs & imaging results that were available during my care of the patient were reviewed by me and considered in my medical decision making (see chart for details).     Metaxalone as below for muscle strain. If symptoms worsen/persist on this regimen, seek additional medical attention. If new/worsening left-sided chest pain, seek immediate medical attention.  Final Clinical Impressions(s) / UC Diagnoses   Final diagnoses:  Muscle strain     Discharge Instructions     Muscle relaxer (metaxalone) up to 3x daily for muscle pain. Take this at night as it can make you drowsy.   Seek additional medical attention if left-sided chest pain, pain down left arm; continued/worsening pain despite  treatment; new or worsening symptoms.    ED Prescriptions    Medication Sig Dispense Auth. Provider   metaxalone (SKELAXIN) 800 MG tablet Take 1 tablet (800 mg total) by mouth 3 (three) times daily. 21 tablet Rhys Martini, PA-C     PDMP not reviewed this  encounter.   Rhys Martini, PA-C 10/06/20 1932

## 2020-10-06 NOTE — ED Triage Notes (Signed)
Patient complains of pain that feels like a pulled muscle under her right breast. Pt states it radiates around to her right flank area intermittently. Pt also states it is worse with deep breaths. Pt si aox4 and ambulatory.

## 2020-10-06 NOTE — Discharge Instructions (Addendum)
Muscle relaxer (metaxalone) up to 3x daily for muscle pain. Take this at night as it can make you drowsy.   Seek additional medical attention if left-sided chest pain, pain down left arm; continued/worsening pain despite treatment; new or worsening symptoms.

## 2020-10-15 ENCOUNTER — Ambulatory Visit: Payer: 59 | Admitting: Nurse Practitioner

## 2020-10-15 ENCOUNTER — Other Ambulatory Visit: Payer: Self-pay

## 2020-10-15 ENCOUNTER — Encounter: Payer: Self-pay | Admitting: Nurse Practitioner

## 2020-10-15 ENCOUNTER — Ambulatory Visit (INDEPENDENT_AMBULATORY_CARE_PROVIDER_SITE_OTHER): Payer: 59 | Admitting: Nurse Practitioner

## 2020-10-15 VITALS — BP 126/78 | HR 87 | Temp 98.2°F | Ht 60.6 in | Wt 183.2 lb

## 2020-10-15 DIAGNOSIS — N1831 Chronic kidney disease, stage 3a: Secondary | ICD-10-CM | POA: Diagnosis not present

## 2020-10-15 DIAGNOSIS — E1121 Type 2 diabetes mellitus with diabetic nephropathy: Secondary | ICD-10-CM | POA: Diagnosis not present

## 2020-10-15 DIAGNOSIS — M545 Low back pain, unspecified: Secondary | ICD-10-CM

## 2020-10-15 MED ORDER — CYCLOBENZAPRINE HCL 5 MG PO TABS: 5.0000 mg | ORAL_TABLET | Freq: Three times a day (TID) | ORAL | 0 refills | Status: DC | PRN

## 2020-10-15 NOTE — Patient Instructions (Signed)
Acute Back Pain, Adult Acute back pain is sudden and usually short-lived. It is often caused by an injury to the muscles and tissues in the back. The injury may result from:  A muscle or ligament getting overstretched or torn (strained). Ligaments are tissues that connect bones to each other. Lifting something improperly can cause a back strain.  Wear and tear (degeneration) of the spinal disks. Spinal disks are circular tissue that provide cushioning between the bones of the spine (vertebrae).  Twisting motions, such as while playing sports or doing yard work.  A hit to the back.  Arthritis. You may have a physical exam, lab tests, and imaging tests to find the cause of your pain. Acute back pain usually goes away with rest and home care. Follow these instructions at home: Managing pain, stiffness, and swelling  Treatment may include medicines for pain and inflammation that are taken by mouth or applied to the skin, prescription pain medicine, or muscle relaxants. Take over-the-counter and prescription medicines only as told by your health care provider.  Your health care provider may recommend applying ice during the first 24-48 hours after your pain starts. To do this: ? Put ice in a plastic bag. ? Place a towel between your skin and the bag. ? Leave the ice on for 20 minutes, 2-3 times a day.  If directed, apply heat to the affected area as often as told by your health care provider. Use the heat source that your health care provider recommends, such as a moist heat pack or a heating pad. ? Place a towel between your skin and the heat source. ? Leave the heat on for 20-30 minutes. ? Remove the heat if your skin turns bright red. This is especially important if you are unable to feel pain, heat, or cold. You have a greater risk of getting burned. Activity  Do not stay in bed. Staying in bed for more than 1-2 days can delay your recovery.  Sit up and stand up straight. Avoid leaning  forward when you sit or hunching over when you stand. ? If you work at a desk, sit close to it so you do not need to lean over. Keep your chin tucked in. Keep your neck drawn back, and keep your elbows bent at a 90-degree angle (right angle). ? Sit high and close to the steering wheel when you drive. Add lower back (lumbar) support to your car seat, if needed.  Take short walks on even surfaces as soon as you are able. Try to increase the length of time you walk each day.  Do not sit, drive, or stand in one place for more than 30 minutes at a time. Sitting or standing for long periods of time can put stress on your back.  Do not drive or use heavy machinery while taking prescription pain medicine.  Use proper lifting techniques. When you bend and lift, use positions that put less stress on your back: ? Bend your knees. ? Keep the load close to your body. ? Avoid twisting.  Exercise regularly as told by your health care provider. Exercising helps your back heal faster and helps prevent back injuries by keeping muscles strong and flexible.  Work with a physical therapist to make a safe exercise program, as recommended by your health care provider. Do any exercises as told by your physical therapist.   Lifestyle  Maintain a healthy weight. Extra weight puts stress on your back and makes it difficult to have   good posture.  Avoid activities or situations that make you feel anxious or stressed. Stress and anxiety increase muscle tension and can make back pain worse. Learn ways to manage anxiety and stress, such as through exercise. General instructions  Sleep on a firm mattress in a comfortable position. Try lying on your side with your knees slightly bent. If you lie on your back, put a pillow under your knees.  Follow your treatment plan as told by your health care provider. This may include: ? Cognitive or behavioral therapy. ? Acupuncture or massage therapy. ? Meditation or yoga. Contact  a health care provider if:  You have pain that is not relieved with rest or medicine.  You have increasing pain going down into your legs or buttocks.  Your pain does not improve after 2 weeks.  You have pain at night.  You lose weight without trying.  You have a fever or chills. Get help right away if:  You develop new bowel or bladder control problems.  You have unusual weakness or numbness in your arms or legs.  You develop nausea or vomiting.  You develop abdominal pain.  You feel faint. Summary  Acute back pain is sudden and usually short-lived.  Use proper lifting techniques. When you bend and lift, use positions that put less stress on your back.  Take over-the-counter and prescription medicines and apply heat or ice as directed by your health care provider. This information is not intended to replace advice given to you by your health care provider. Make sure you discuss any questions you have with your health care provider. Document Revised: 06/06/2020 Document Reviewed: 06/06/2020 Elsevier Patient Education  2021 Elsevier Inc.  

## 2020-10-15 NOTE — Progress Notes (Signed)
I,Tianna Badgett,acting as a Neurosurgeon for [employer], NP.,have documented all relevant documentation on the behalf of [employer], NP,as directed by  Charlesetta Ivory, NP while in the presence of Charlesetta Ivory, NP.  This visit occurred during the SARS-CoV-2 public health emergency.  Safety protocols were in place, including screening questions prior to the visit, additional usage of staff PPE, and extensive cleaning of exam room while observing appropriate contact time as indicated for disinfecting solutions.  Subjective:     Patient ID: Bridget Bates , female    DOB: 1965/12/28 , 55 y.o.   MRN: [id number]   Chief Complaint  Patient presents with  . Back Pain    HPI  Patient is here for back pain. She states that this started about 2 weeks ago. She was working in [employer].  She went to urgent care for this pain and medication was sent to the pharmacy. Patient did not pick up this medication because she did not get a call from the pharmacy and her insurance was not covering it. The patient continues to have pain. The pain does not radiate down her leg. The patient does not report any numbness or tingling. She also would like a note for work tomorrow because of her back.   Back Pain This is a new problem. The current episode started 1 to 4 weeks ago. The problem occurs constantly. The problem is unchanged. The pain is present in the lumbar spine. The quality of the pain is described as aching. The pain is at a severity of 3/10. The pain is mild. The pain is worse during the night. Pertinent negatives include no chest pain, dysuria, fever, numbness or weakness. She has tried heat for the symptoms. The treatment provided mild relief.     Past Medical History:  Diagnosis Date  . Diabetes mellitus without complication (HCC)   . Hypertension   . Obesity      Family History  Problem Relation Age of Onset  . Diabetes Mother   . Healthy Father      Current Outpatient  Medications:  .  chlorthalidone (HYGROTON) 25 MG tablet, Monday, Wednesday, Friday only, Disp: 90 tablet, Rfl: 1 .  cyclobenzaprine (FLEXERIL) 5 MG tablet, Take 1 tablet (5 mg total) by mouth 3 (three) times daily as needed for muscle spasms., Disp: 30 tablet, Rfl: 0 .  glucose blood (ONETOUCH VERIO) test strip, Use as instructed to check blood sugars dx code- e11.65, Disp: 100 each, Rfl: 5 .  Lancets (ONETOUCH ULTRASOFT) lancets, Use as instructed, Disp: 100 each, Rfl: 12 .  metFORMIN (GLUCOPHAGE) 500 MG tablet, TAKE 1 TABLET(500 MG) BY MOUTH TWICE DAILY WITH A MEAL, Disp: 180 tablet, Rfl: 1 .  Semaglutide (RYBELSUS) 14 MG TABS, Take 1 tablet by mouth daily., Disp: 30 tablet, Rfl: 2 .  simvastatin (ZOCOR) 10 MG tablet, TAKE 1 TABLET BY MOUTH EVERY DAY IN THE EVENING, Disp: 90 tablet, Rfl: 0 .  telmisartan (MICARDIS) 80 MG tablet, TAKE 1 TABLET(80 MG) BY MOUTH DAILY, Disp: 90 tablet, Rfl: 2   Allergies  Allergen Reactions  . Amoxicillin Hives and Itching     Review of Systems  Constitutional: Negative for fatigue and fever.  Respiratory: Negative for cough and shortness of breath.   Cardiovascular: Negative for chest pain.  Genitourinary: Negative for difficulty urinating, dysuria, flank pain and urgency.  Musculoskeletal: Positive for back pain.  Neurological: Negative for weakness and numbness.     Today's Vitals   10/15/20 1510  BP:  126/78  Pulse: 87  Temp: 98.2 F (36.8 C)  TempSrc: Oral  Weight: 183 lb 3.2 oz (83.1 kg)  Height: 5' 0.6" (1.539 m)   Body mass index is 35.07 kg/m.   Objective:  Physical Exam Constitutional:      Appearance: Normal appearance. She is obese.  HENT:     Head: Normocephalic and atraumatic.  Cardiovascular:     Rate and Rhythm: Normal rate and regular rhythm.     Pulses: Normal pulses.     Heart sounds: Normal heart sounds.  Pulmonary:     Effort: Pulmonary effort is normal.     Breath sounds: Normal breath sounds.  Musculoskeletal:         General: Tenderness present.     Left lower leg: No edema.     Comments: Tenderness to the lower left side.   Skin:    General: Skin is warm and dry.  Neurological:     Mental Status: She is alert.  Psychiatric:        Mood and Affect: Mood normal.        Behavior: Behavior normal.        Thought Content: Thought content normal.        Judgment: Judgment normal.         Assessment And Plan:     1. Acute left-sided low back pain without sciatica -Could be due to a pulled muscle from lifting/picking up something heavy  - cyclobenzaprine (FLEXERIL) 5 MG tablet; Take 1 tablet (5 mg total) by mouth 3 (three) times daily as needed for muscle spasms.  Dispense: 30 tablet; Refill: 0 -Patient instructed to apply heating pad as needed for back as needed.  -Patient instructed to take medicine at night as it might cause drowsiness.  -Patient to return to office if pain gets worse  -Patient given note for work to return on Friday due to her backache.   Follow up if pain does not get any better or get worse.    Patient was given opportunity to ask questions. Patient verbalized understanding of the plan and was able to repeat key elements of the plan. All questions were answered to their satisfaction.  Charlesetta Ivory, NP   I, Charlesetta Ivory, NP, have reviewed all documentation for this visit. The documentation on 10/15/20 for the exam, diagnosis, procedures, and orders are all accurate and complete.  THE PATIENT IS ENCOURAGED TO PRACTICE SOCIAL DISTANCING DUE TO THE COVID-19 PANDEMIC.

## 2020-11-27 ENCOUNTER — Other Ambulatory Visit: Payer: Self-pay | Admitting: Internal Medicine

## 2020-12-05 ENCOUNTER — Other Ambulatory Visit: Payer: Self-pay | Admitting: Internal Medicine

## 2020-12-08 ENCOUNTER — Ambulatory Visit: Payer: 59 | Admitting: Internal Medicine

## 2021-02-22 ENCOUNTER — Other Ambulatory Visit: Payer: Self-pay | Admitting: Internal Medicine

## 2021-02-25 ENCOUNTER — Other Ambulatory Visit: Payer: Self-pay | Admitting: Internal Medicine

## 2021-03-04 ENCOUNTER — Other Ambulatory Visit: Payer: Self-pay

## 2021-03-04 ENCOUNTER — Encounter: Payer: Self-pay | Admitting: Internal Medicine

## 2021-03-04 ENCOUNTER — Ambulatory Visit (INDEPENDENT_AMBULATORY_CARE_PROVIDER_SITE_OTHER): Payer: 59 | Admitting: Internal Medicine

## 2021-03-04 VITALS — BP 120/78 | HR 95 | Temp 98.5°F | Ht 60.6 in | Wt 164.0 lb

## 2021-03-04 DIAGNOSIS — E1122 Type 2 diabetes mellitus with diabetic chronic kidney disease: Secondary | ICD-10-CM | POA: Diagnosis not present

## 2021-03-04 DIAGNOSIS — Z23 Encounter for immunization: Secondary | ICD-10-CM

## 2021-03-04 DIAGNOSIS — Z1231 Encounter for screening mammogram for malignant neoplasm of breast: Secondary | ICD-10-CM

## 2021-03-04 DIAGNOSIS — I129 Hypertensive chronic kidney disease with stage 1 through stage 4 chronic kidney disease, or unspecified chronic kidney disease: Secondary | ICD-10-CM | POA: Diagnosis not present

## 2021-03-04 DIAGNOSIS — N1831 Chronic kidney disease, stage 3a: Secondary | ICD-10-CM | POA: Diagnosis not present

## 2021-03-04 DIAGNOSIS — E6609 Other obesity due to excess calories: Secondary | ICD-10-CM

## 2021-03-04 DIAGNOSIS — Z6831 Body mass index (BMI) 31.0-31.9, adult: Secondary | ICD-10-CM

## 2021-03-04 MED ORDER — SHINGRIX 50 MCG/0.5ML IM SUSR: 0.5000 mL | Freq: Once | INTRAMUSCULAR | 0 refills | Status: AC

## 2021-03-04 NOTE — Progress Notes (Signed)
I,Katawbba Wiggins,acting as a Education administrator for Maximino Greenland, MD.,have documented all relevant documentation on the behalf of Maximino Greenland, MD,as directed by  Maximino Greenland, MD while in the presence of Maximino Greenland, MD.  This visit occurred during the SARS-CoV-2 public health emergency.  Safety protocols were in place, including screening questions prior to the visit, additional usage of staff PPE, and extensive cleaning of exam room while observing appropriate contact time as indicated for disinfecting solutions.  Subjective:     Patient ID: Bridget Bates , female    DOB: 07-04-1966 , 55 y.o.   MRN: [id number]   Chief Complaint  Patient presents with   Diabetes   Hypertension    HPI  The patient is here today for a follow-up on her diabetes and blood pressure.  She reports compliance with meds. She denies headaches, chest pain and shortness of breath.   Diabetes She presents for her follow-up diabetic visit. She has type 2 diabetes mellitus. Her disease course has been stable. There are no hypoglycemic associated symptoms. Pertinent negatives for diabetes include no blurred vision and no chest pain. There are no hypoglycemic complications. Risk factors for coronary artery disease include diabetes mellitus, dyslipidemia, hypertension, sedentary lifestyle and obesity. She never participates in exercise. Her breakfast blood glucose is taken between 8-9 am. Her breakfast blood glucose range is generally 110-130 mg/dl. An ACE inhibitor/angiotensin II receptor blocker is being taken. Eye exam is not current.  Hypertension This is a chronic problem. The current episode started more than 1 year ago. The problem has been gradually improving since onset. The problem is uncontrolled. Pertinent negatives include no blurred vision, chest pain, palpitations or shortness of breath. The current treatment provides moderate improvement.    Past Medical History:  Diagnosis Date   Diabetes mellitus  without complication ([place])    Hypertension    Obesity      Family History  Problem Relation Age of Onset   Diabetes Mother    Healthy Father      Current Outpatient Medications:    chlorthalidone (HYGROTON) 25 MG tablet, TAKE 1 TABLET BY MOUTH MONDAY, WEDNESDAY, FRIDAY ONLY, Disp: 90 tablet, Rfl: 1   cyclobenzaprine (FLEXERIL) 5 MG tablet, Take 1 tablet (5 mg total) by mouth 3 (three) times daily as needed for muscle spasms., Disp: 30 tablet, Rfl: 0   glucose blood (ONETOUCH VERIO) test strip, Use as instructed to check blood sugars dx code- e11.65, Disp: 100 each, Rfl: 5   Lancets (ONETOUCH ULTRASOFT) lancets, Use as instructed, Disp: 100 each, Rfl: 12   metFORMIN (GLUCOPHAGE) 500 MG tablet, TAKE 1 TABLET(500 MG) BY MOUTH TWICE DAILY WITH A MEAL, Disp: 180 tablet, Rfl: 1   RYBELSUS 14 MG TABS, TAKE 1 TABLET BY MOUTH DAILY, Disp: 30 tablet, Rfl: 2   simvastatin (ZOCOR) 10 MG tablet, TAKE 1 TABLET BY MOUTH EVERY DAY IN THE EVENING, Disp: 90 tablet, Rfl: 0   telmisartan (MICARDIS) 80 MG tablet, TAKE 1 TABLET(80 MG) BY MOUTH DAILY, Disp: 90 tablet, Rfl: 2   Allergies  Allergen Reactions   Amoxicillin Hives and Itching     Review of Systems  Constitutional: Negative.   Eyes:  Negative for blurred vision.  Respiratory: Negative.  Negative for shortness of breath.   Cardiovascular: Negative.  Negative for chest pain and palpitations.  Gastrointestinal: Negative.   Psychiatric/Behavioral: Negative.    All other systems reviewed and are negative.   Today's Vitals   03/04/21 1548  BP: 120/78  Pulse: 95  Temp: 98.5 F (36.9 C)  TempSrc: Oral  Weight: 164 lb (74.4 kg)  Height: 5' 0.6" (1.539 m)   Body mass index is 31.4 kg/m.  Wt Readings from Last 3 Encounters:  03/04/21 164 lb (74.4 kg)  10/15/20 183 lb 3.2 oz (83.1 kg)  06/09/20 204 lb 6.4 oz (92.7 kg)   BP Readings from Last 3 Encounters:  03/04/21 120/78  10/15/20 126/78  10/06/20 108/77   Objective:  Physical  Exam Vitals and nursing note reviewed.  Constitutional:      Appearance: Normal appearance.  HENT:     Head: Normocephalic and atraumatic.     Nose:     Comments: Masked     Mouth/Throat:     Comments: Masked  Cardiovascular:     Rate and Rhythm: Normal rate and regular rhythm.     Heart sounds: Normal heart sounds.  Pulmonary:     Effort: Pulmonary effort is normal.     Breath sounds: Normal breath sounds.  Skin:    General: Skin is warm.  Neurological:     General: No focal deficit present.     Mental Status: She is alert.  Psychiatric:        Mood and Affect: Mood normal.        Behavior: Behavior normal.        Assessment And Plan:     1. Type 2 diabetes mellitus with stage 3a chronic kidney disease, without long-term current use of insulin (HCC) Comments: Chronic, I will check labs as listed below. Importance of dietary compliance was d/w patient. I will consider decreasing to metformin once daily.  - Hemoglobin A1c - CMP14+EGFR - CBC no Diff - Lipid panel - TSH  2. Hypertensive nephropathy Comments: Chronic, well controlled. She is encouraged to follow low sodium diet. I will check renal function today.  - CMP14+EGFR - CBC no Diff - Lipid panel  3. Class 1 obesity due to excess calories with serious comorbidity and body mass index (BMI) of 31.0 to 31.9 in adult Comments: She was congratulated on her 40 lbs weight loss since September 2021.   4. Encounter for screening mammogram for malignant neoplasm of breast Comments: I will refer her for mammogram.  She is encouraged to perform monthly self breast exams.  - MM Digital Screening; Future  5. Immunization due Comments: I will send rx Shingrix to local pharmacy.   Patient was given opportunity to ask questions. Patient verbalized understanding of the plan and was able to repeat key elements of the plan. All questions were answered to their satisfaction.   I, Maximino Greenland, MD, have reviewed all  documentation for this visit. The documentation on 03/14/21 for the exam, diagnosis, procedures, and orders are all accurate and complete.   IF YOU HAVE BEEN REFERRED TO A SPECIALIST, IT MAY TAKE 1-2 WEEKS TO SCHEDULE/PROCESS THE REFERRAL. IF YOU HAVE NOT HEARD FROM US/SPECIALIST IN TWO WEEKS, PLEASE GIVE [place] A CALL AT [phone].   THE PATIENT IS ENCOURAGED TO PRACTICE SOCIAL DISTANCING DUE TO THE COVID-19 PANDEMIC.

## 2021-03-04 NOTE — Patient Instructions (Signed)
Diabetes Mellitus and Foot Care Foot care is an important part of your health, especially when you have diabetes. Diabetes may cause you to have problems because of poor blood flow (circulation) to your feet and legs, which can cause your skin to:  Become thinner and drier.  Break more easily.  Heal more slowly.  Peel and crack. You may also have nerve damage (neuropathy) in your legs and feet, causing decreased feeling in them. This means that you may not notice minor injuries to your feet that could lead to more serious problems. Noticing and addressing any potential problems early is the best way to prevent future foot problems. How to care for your feet Foot hygiene  Wash your feet daily with warm water and mild soap. Do not use hot water. Then, pat your feet and the areas between your toes until they are completely dry. Do not soak your feet as this can dry your skin.  Trim your toenails straight across. Do not dig under them or around the cuticle. File the edges of your nails with an emery board or nail file.  Apply a moisturizing lotion or petroleum jelly to the skin on your feet and to dry, brittle toenails. Use lotion that does not contain alcohol and is unscented. Do not apply lotion between your toes.   Shoes and socks  Wear clean socks or stockings every day. Make sure they are not too tight. Do not wear knee-high stockings since they may decrease blood flow to your legs.  Wear shoes that fit properly and have enough cushioning. Always look in your shoes before you put them on to be sure there are no objects inside.  To break in new shoes, wear them for just a few hours a day. This prevents injuries on your feet. Wounds, scrapes, corns, and calluses  Check your feet daily for blisters, cuts, bruises, sores, and redness. If you cannot see the bottom of your feet, use a mirror or ask someone for help.  Do not cut corns or calluses or try to remove them with medicine.  If you  find a minor scrape, cut, or break in the skin on your feet, keep it and the skin around it clean and dry. You may clean these areas with mild soap and water. Do not clean the area with peroxide, alcohol, or iodine.  If you have a wound, scrape, corn, or callus on your foot, look at it several times a day to make sure it is healing and not infected. Check for: ? Redness, swelling, or pain. ? Fluid or blood. ? Warmth. ? Pus or a bad smell.   General tips  Do not cross your legs. This may decrease blood flow to your feet.  Do not use heating pads or hot water bottles on your feet. They may burn your skin. If you have lost feeling in your feet or legs, you may not know this is happening until it is too late.  Protect your feet from hot and cold by wearing shoes, such as at the beach or on hot pavement.  Schedule a complete foot exam at least once a year (annually) or more often if you have foot problems. Report any cuts, sores, or bruises to your health care provider immediately. Where to find more information  American Diabetes Association: www.diabetes.org  Association of Diabetes Care & Education Specialists: www.diabeteseducator.org Contact a health care provider if:  You have a medical condition that increases your risk of infection and   you have any cuts, sores, or bruises on your feet.  You have an injury that is not healing.  You have redness on your legs or feet.  You feel burning or tingling in your legs or feet.  You have pain or cramps in your legs and feet.  Your legs or feet are numb.  Your feet always feel cold.  You have pain around any toenails. Get help right away if:  You have a wound, scrape, corn, or callus on your foot and: ? You have pain, swelling, or redness that gets worse. ? You have fluid or blood coming from the wound, scrape, corn, or callus. ? Your wound, scrape, corn, or callus feels warm to the touch. ? You have pus or a bad smell coming from  the wound, scrape, corn, or callus. ? You have a fever. ? You have a red line going up your leg. Summary  Check your feet every day for blisters, cuts, bruises, sores, and redness.  Apply a moisturizing lotion or petroleum jelly to the skin on your feet and to dry, brittle toenails.  Wear shoes that fit properly and have enough cushioning.  If you have foot problems, report any cuts, sores, or bruises to your health care provider immediately.  Schedule a complete foot exam at least once a year (annually) or more often if you have foot problems. This information is not intended to replace advice given to you by your health care provider. Make sure you discuss any questions you have with your health care provider. Document Revised: 04/03/2020 Document Reviewed: 04/03/2020 Elsevier Patient Education  2021 [employer] Inc.  

## 2021-03-05 LAB — CMP14+EGFR
ALT: 4 IU/L (ref 0–32)
AST: 9 IU/L (ref 0–40)
Albumin/Globulin Ratio: 1.3 (ref 1.2–2.2)
Albumin: 4 g/dL (ref 3.8–4.9)
Alkaline Phosphatase: 67 IU/L (ref 44–121)
BUN/Creatinine Ratio: 15 (ref 9–23)
BUN: 24 mg/dL (ref 6–24)
Bilirubin Total: 0.2 mg/dL (ref 0.0–1.2)
CO2: 24 mmol/L (ref 20–29)
Calcium: 9.5 mg/dL (ref 8.7–10.2)
Chloride: 98 mmol/L (ref 96–106)
Creatinine, Ser: 1.61 mg/dL — ABNORMAL HIGH (ref 0.57–1.00)
Globulin, Total: 3.1 g/dL (ref 1.5–4.5)
Glucose: 79 mg/dL (ref 65–99)
Potassium: 4.7 mmol/L (ref 3.5–5.2)
Sodium: 136 mmol/L (ref 134–144)
Total Protein: 7.1 g/dL (ref 6.0–8.5)
eGFR: 38 mL/min/{1.73_m2} — ABNORMAL LOW (ref >59)

## 2021-03-05 LAB — LIPID PANEL
Chol/HDL Ratio: 2.9 ratio (ref 0.0–4.4)
Cholesterol, Total: 140 mg/dL (ref 100–199)
HDL: 48 mg/dL (ref >39)
LDL Chol Calc (NIH): 77 mg/dL (ref 0–99)
Triglycerides: 76 mg/dL (ref 0–149)
VLDL Cholesterol Cal: 15 mg/dL (ref 5–40)

## 2021-03-05 LAB — CBC
Hematocrit: 33.4 % — ABNORMAL LOW (ref 34.0–46.6)
Hemoglobin: 10.4 g/dL — ABNORMAL LOW (ref 11.1–15.9)
MCH: 24.4 pg — ABNORMAL LOW (ref 26.6–33.0)
MCHC: 31.1 g/dL — ABNORMAL LOW (ref 31.5–35.7)
MCV: 78 fL — ABNORMAL LOW (ref 79–97)
Platelets: 233 10*3/uL (ref 150–450)
RBC: 4.26 x10E6/uL (ref 3.77–5.28)
RDW: 14.1 % (ref 11.7–15.4)
WBC: 5.8 10*3/uL (ref 3.4–10.8)

## 2021-03-05 LAB — TSH: TSH: 0.624 u[IU]/mL (ref 0.450–4.500)

## 2021-03-05 LAB — HEMOGLOBIN A1C
Est. average glucose Bld gHb Est-mCnc: 117 mg/dL
Hgb A1c MFr Bld: 5.7 % — ABNORMAL HIGH (ref 4.8–5.6)

## 2021-03-06 ENCOUNTER — Other Ambulatory Visit: Payer: Self-pay

## 2021-03-06 DIAGNOSIS — N183 Chronic kidney disease, stage 3 unspecified: Secondary | ICD-10-CM

## 2021-03-10 LAB — PROTEIN ELECTROPHORESIS
A/G Ratio: 1.1 (ref 0.7–1.7)
Albumin ELP: 3.6 g/dL (ref 2.9–4.4)
Alpha 1: 0.2 g/dL (ref 0.0–0.4)
Alpha 2: 0.7 g/dL (ref 0.4–1.0)
Beta: 1 g/dL (ref 0.7–1.3)
Gamma Globulin: 1.3 g/dL (ref 0.4–1.8)
Globulin, Total: 3.2 g/dL (ref 2.2–3.9)
Total Protein: 6.8 g/dL (ref 6.0–8.5)

## 2021-03-10 LAB — IRON AND TIBC
Iron Saturation: 11 % — ABNORMAL LOW (ref 15–55)
Iron: 32 ug/dL (ref 27–159)
Total Iron Binding Capacity: 281 ug/dL (ref 250–450)
UIBC: 249 ug/dL (ref 131–425)

## 2021-03-10 LAB — T4, FREE: Free T4: 1.41 ng/dL (ref 0.82–1.77)

## 2021-03-10 LAB — FERRITIN: Ferritin: 145 ng/mL (ref 15–150)

## 2021-03-10 LAB — SPECIMEN STATUS REPORT

## 2021-03-31 ENCOUNTER — Other Ambulatory Visit: Payer: Self-pay

## 2021-03-31 ENCOUNTER — Other Ambulatory Visit: Payer: 59

## 2021-03-31 DIAGNOSIS — N183 Chronic kidney disease, stage 3 unspecified: Secondary | ICD-10-CM

## 2021-03-31 DIAGNOSIS — Z1231 Encounter for screening mammogram for malignant neoplasm of breast: Secondary | ICD-10-CM

## 2021-04-01 ENCOUNTER — Other Ambulatory Visit: Payer: Self-pay | Admitting: Internal Medicine

## 2021-04-01 LAB — BMP8+EGFR
BUN/Creatinine Ratio: 18 (ref 9–23)
BUN: 23 mg/dL (ref 6–24)
CO2: 24 mmol/L (ref 20–29)
Calcium: 9.5 mg/dL (ref 8.7–10.2)
Chloride: 102 mmol/L (ref 96–106)
Creatinine, Ser: 1.3 mg/dL — ABNORMAL HIGH (ref 0.57–1.00)
Glucose: 83 mg/dL (ref 65–99)
Potassium: 4.2 mmol/L (ref 3.5–5.2)
Sodium: 139 mmol/L (ref 134–144)
eGFR: 49 mL/min/{1.73_m2} — ABNORMAL LOW (ref >59)

## 2021-05-05 ENCOUNTER — Other Ambulatory Visit (INDEPENDENT_AMBULATORY_CARE_PROVIDER_SITE_OTHER): Payer: 59

## 2021-05-05 DIAGNOSIS — D649 Anemia, unspecified: Secondary | ICD-10-CM

## 2021-05-05 LAB — HEMOCCULT GUIAC POC 1CARD (OFFICE)
Card #2 Fecal Occult Blod, POC: POSITIVE
Card #3 Fecal Occult Blood, POC: POSITIVE
Fecal Occult Blood, POC: POSITIVE — AB

## 2021-05-07 ENCOUNTER — Other Ambulatory Visit: Payer: Self-pay

## 2021-05-07 DIAGNOSIS — K921 Melena: Secondary | ICD-10-CM

## 2021-05-24 ENCOUNTER — Other Ambulatory Visit: Payer: Self-pay | Admitting: Internal Medicine

## 2021-06-14 NOTE — Progress Notes (Signed)
I,Bridget Bates,acting as a Education administrator for Bridget Greenland, MD.,have documented all relevant documentation on the behalf of Bridget Greenland, MD,as directed by  Bridget Greenland, MD while in the presence of Bridget Greenland, MD.  This visit occurred during the SARS-CoV-2 public health emergency.  Safety protocols were in place, including screening questions prior to the visit, additional usage of staff PPE, and extensive cleaning of exam room while observing appropriate contact time as indicated for disinfecting solutions.  Subjective:     Patient ID: Bridget Bates , female    DOB: 07/23/1966 , 55 y.o.   MRN: [id number]   Chief Complaint  Patient presents with   Annual Exam   Diabetes   Hypertension    HPI  She is here today for a full physical examination. She had her last pap smear in 2019 she still does not have a GYN.   Diabetes She presents for her follow-up diabetic visit. She has type 2 diabetes mellitus. Her disease course has been stable. There are no hypoglycemic associated symptoms. Pertinent negatives for diabetes include no blurred vision and no chest pain. There are no hypoglycemic complications. Risk factors for coronary artery disease include diabetes mellitus, dyslipidemia, hypertension, sedentary lifestyle and obesity. She never participates in exercise. Her breakfast blood glucose is taken between 8-9 am. Her breakfast blood glucose range is generally 110-130 mg/dl. An ACE inhibitor/angiotensin II receptor blocker is being taken. Eye exam is not current.  Hypertension This is a chronic problem. The current episode started more than 1 year ago. The problem has been gradually improving since onset. The problem is uncontrolled. Pertinent negatives include no blurred vision, chest pain, palpitations or shortness of breath.    Past Medical History:  Diagnosis Date   Diabetes mellitus without complication ([place])    Hypertension    Obesity      Family History  Problem  Relation Age of Onset   Diabetes Mother    Healthy Father      Current Outpatient Medications:    chlorthalidone (HYGROTON) 25 MG tablet, TAKE 1 TABLET BY MOUTH MONDAY, WEDNESDAY, FRIDAY ONLY, Disp: 90 tablet, Rfl: 1   cyclobenzaprine (FLEXERIL) 5 MG tablet, Take 1 tablet (5 mg total) by mouth 3 (three) times daily as needed for muscle spasms., Disp: 30 tablet, Rfl: 0   glucose blood (ONETOUCH VERIO) test strip, Use as instructed to check blood sugars dx code- e11.65, Disp: 100 each, Rfl: 5   Lancets (ONETOUCH ULTRASOFT) lancets, Use as instructed, Disp: 100 each, Rfl: 12   metFORMIN (GLUCOPHAGE) 500 MG tablet, TAKE 1 TABLET(500 MG) BY MOUTH TWICE DAILY WITH A MEAL, Disp: 180 tablet, Rfl: 1   RYBELSUS 14 MG TABS, TAKE 1 TABLET BY MOUTH DAILY, Disp: 30 tablet, Rfl: 2   simvastatin (ZOCOR) 10 MG tablet, TAKE 1 TABLET BY MOUTH EVERY DAY IN THE EVENING, Disp: 90 tablet, Rfl: 0   telmisartan (MICARDIS) 80 MG tablet, TAKE 1 TABLET(80 MG) BY MOUTH DAILY, Disp: 90 tablet, Rfl: 2   Zoster Vaccine Adjuvanted (SHINGRIX) injection, Inject 0.5 mLs into the muscle once for 1 dose., Disp: 0.5 mL, Rfl: 0   Allergies  Allergen Reactions   Amoxicillin Hives and Itching      The patient states she uses post menopausal status for birth control. Last LMP was No LMP recorded. (Menstrual status: Perimenopausal).. Negative for Dysmenorrhea. Negative for: breast discharge, breast lump(s), breast pain and breast self exam. Associated symptoms include abnormal vaginal bleeding. Pertinent negatives include abnormal  bleeding (hematology), anxiety, decreased libido, depression, difficulty falling sleep, dyspareunia, history of infertility, nocturia, sexual dysfunction, sleep disturbances, urinary incontinence, urinary urgency, vaginal discharge and vaginal itching. Diet regular.The patient states her exercise level is  mode  . The patient's tobacco use is:  Social History   Tobacco Use  Smoking Status Never   Smokeless Tobacco Never  . She has been exposed to passive smoke. The patient's alcohol use is:  Social History   Substance and Sexual Activity  Alcohol Use No   Review of Systems  Constitutional: Negative.   HENT: Negative.    Eyes: Negative.  Negative for blurred vision.  Respiratory: Negative.  Negative for shortness of breath.   Cardiovascular: Negative.  Negative for chest pain and palpitations.  Gastrointestinal: Negative.   Endocrine: Negative.   Genitourinary: Negative.   Musculoskeletal: Negative.   Skin: Negative.   Allergic/Immunologic: Negative.   Neurological: Negative.   Hematological: Negative.   Psychiatric/Behavioral: Negative.      Today's Vitals   06/15/21 0835  BP: 112/70  Pulse: (!) 102  Temp: 99.6 F (37.6 C)  Weight: 164 lb 9.6 oz (74.7 kg)  Height: 5' 0.6" (1.539 m)  PainSc: 0-No pain   Body mass index is 31.51 kg/m.  Wt Readings from Last 3 Encounters:  06/15/21 164 lb 9.6 oz (74.7 kg)  03/04/21 164 lb (74.4 kg)  10/15/20 183 lb 3.2 oz (83.1 kg)     Objective:  Physical Exam Vitals and nursing note reviewed.  Constitutional:      Appearance: Normal appearance.  HENT:     Head: Normocephalic and atraumatic.     Right Ear: Tympanic membrane, ear canal and external ear normal.     Left Ear: Tympanic membrane, ear canal and external ear normal.     Nose:     Comments: Masked     Mouth/Throat:     Comments: Masked  Eyes:     Extraocular Movements: Extraocular movements intact.     Conjunctiva/sclera: Conjunctivae normal.     Pupils: Pupils are equal, round, and reactive to light.  Cardiovascular:     Rate and Rhythm: Normal rate and regular rhythm.     Pulses:          Dorsalis pedis pulses are 2+ on the right side and 2+ on the left side.     Heart sounds: Normal heart sounds.  Pulmonary:     Effort: Pulmonary effort is normal.     Breath sounds: Normal breath sounds.  Chest:  Breasts:    Tanner Score is 5.     Right: Normal.      Left: Normal.  Abdominal:     General: Bowel sounds are normal.     Palpations: Abdomen is soft.  Genitourinary:    Comments: deferred Musculoskeletal:        General: Normal range of motion.     Cervical back: Normal range of motion and neck supple.     Comments: Decreased range of motion due to pain  Feet:     Right foot:     Protective Sensation: 5 sites tested.  5 sites sensed.     Skin integrity: Dry skin present.     Toenail Condition: Right toenails are normal.     Left foot:     Protective Sensation: 5 sites tested.  5 sites sensed.     Skin integrity: Dry skin present.     Toenail Condition: Left toenails are normal.  Skin:  General: Skin is warm and dry.  Neurological:     General: No focal deficit present.     Mental Status: She is alert and oriented to person, place, and time.  Psychiatric:        Mood and Affect: Mood normal.        Behavior: Behavior normal.        Assessment And Plan:     1. Routine general medical examination at health care facility Comments: A full exam was performed. Importance of monthly self breast exams was discussed with the patient. PATIENT IS ADVISED TO GET 30-45 MINUTES REGULAR EXERCISE NO LESS THAN FOUR TO FIVE DAYS PER WEEK - BOTH WEIGHTBEARING EXERCISES AND AEROBIC ARE RECOMMENDED.  PATIENT IS ADVISED TO FOLLOW A HEALTHY DIET WITH AT LEAST SIX FRUITS/VEGGIES PER DAY, DECREASE INTAKE OF RED MEAT, AND TO INCREASE FISH INTAKE TO TWO DAYS PER WEEK.  MEATS/FISH SHOULD NOT BE FRIED, BAKED OR BROILED IS PREFERABLE.  IT IS ALSO IMPORTANT TO CUT BACK ON YOUR SUGAR INTAKE. PLEASE AVOID ANYTHING WITH ADDED SUGAR, CORN SYRUP OR OTHER SWEETENERS. IF YOU MUST USE A SWEETENER, YOU CAN TRY STEVIA. IT IS ALSO IMPORTANT TO AVOID ARTIFICIALLY SWEETENERS AND DIET BEVERAGES. LASTLY, I SUGGEST WEARING SPF 50 SUNSCREEN ON EXPOSED PARTS AND ESPECIALLY WHEN IN THE DIRECT SUNLIGHT FOR AN EXTENDED PERIOD OF TIME.  PLEASE AVOID FAST FOOD RESTAURANTS AND  INCREASE YOUR WATER INTAKE.  - CMP14+EGFR - CBC - POC Hemoccult Bld/Stl (1-Cd Office Dx)  2. Cervical smear, as part of routine gynecological examination Comments: Pap smear performed. Stool heme negative.  - Cytology -Pap Smear - POC Hemoccult Bld/Stl (1-Cd Office Dx)  3. Type 2 diabetes mellitus with stage 3a chronic kidney disease, without long-term current use of insulin (HCC) Comments: Chronic, diabetic foot exam was performed. I will refer her for diabetic eye exam.  I will also refer her to Renal for further evaluation of stage 3 CKD. We discussed the possible use of [place] as well. I will make further recommendations once her labs are available for review. I DISCUSSED WITH THE PATIENT AT LENGTH REGARDING THE GOALS OF GLYCEMIC CONTROL AND POSSIBLE LONG-TERM COMPLICATIONS.  I  ALSO STRESSED THE IMPORTANCE OF COMPLIANCE WITH HOME GLUCOSE MONITORING, DIETARY RESTRICTIONS INCLUDING AVOIDANCE OF SUGARY DRINKS/PROCESSED FOODS,  ALONG WITH REGULAR EXERCISE.  I  ALSO STRESSED THE IMPORTANCE OF ANNUAL EYE EXAMS, SELF FOOT CARE AND COMPLIANCE WITH OFFICE VISITS.  - POCT Urinalysis Dipstick (81002) - POCT UA - Microalbumin - Hemoglobin A1c - Ambulatory referral to Nephrology - Ambulatory referral to Ophthalmology  4. Hypertensive nephropathy Comments: Chronic, well controlled. EKG performed, ST w/o acute changes.Pt admits that her dog was hit in front of her home this morning. No med changes. She will rto in 4 months for re-evaluation.  - EKG 12-Lead  5. Chronic left shoulder pain Comments: Sx suggestive of frozen shoulder. I wil refer her to Ortho for further evaluation.  - Ambulatory referral to Orthopedic Surgery  6. Immunization due - Zoster Vaccine Adjuvanted [facility]) injection; Inject 0.5 mLs into the muscle once for 1 dose.  Dispense: 0.5 mL; Refill: 0  7. Influenza vaccination declined  Patient was given opportunity to ask questions. Patient verbalized understanding of the  plan and was able to repeat key elements of the plan. All questions were answered to their satisfaction.   I, Bridget Greenland, MD, have reviewed all documentation for this visit. The documentation on 06/15/21 for the exam, diagnosis, procedures, and orders are  all accurate and complete.  THE PATIENT IS ENCOURAGED TO PRACTICE SOCIAL DISTANCING DUE TO THE COVID-19 PANDEMIC.

## 2021-06-15 ENCOUNTER — Other Ambulatory Visit: Payer: Self-pay

## 2021-06-15 ENCOUNTER — Other Ambulatory Visit (HOSPITAL_COMMUNITY)
Admission: RE | Admit: 2021-06-15 | Discharge: 2021-06-15 | Disposition: A | Discharge status: Home / Self Care | Payer: 59 | Source: Ambulatory Visit | Attending: Internal Medicine | Admitting: Internal Medicine

## 2021-06-15 ENCOUNTER — Ambulatory Visit (INDEPENDENT_AMBULATORY_CARE_PROVIDER_SITE_OTHER): Payer: 59 | Admitting: Internal Medicine

## 2021-06-15 ENCOUNTER — Encounter: Payer: Self-pay | Admitting: Internal Medicine

## 2021-06-15 VITALS — BP 112/70 | HR 102 | Temp 99.6°F | Ht 60.6 in | Wt 164.6 lb

## 2021-06-15 DIAGNOSIS — E7800 Pure hypercholesterolemia, unspecified: Secondary | ICD-10-CM

## 2021-06-15 DIAGNOSIS — Z23 Encounter for immunization: Secondary | ICD-10-CM | POA: Diagnosis not present

## 2021-06-15 DIAGNOSIS — M25512 Pain in left shoulder: Secondary | ICD-10-CM

## 2021-06-15 DIAGNOSIS — Z2821 Immunization not carried out because of patient refusal: Secondary | ICD-10-CM

## 2021-06-15 DIAGNOSIS — N1831 Chronic kidney disease, stage 3a: Secondary | ICD-10-CM

## 2021-06-15 DIAGNOSIS — Z01419 Encounter for gynecological examination (general) (routine) without abnormal findings: Secondary | ICD-10-CM | POA: Insufficient documentation

## 2021-06-15 DIAGNOSIS — E1122 Type 2 diabetes mellitus with diabetic chronic kidney disease: Secondary | ICD-10-CM | POA: Diagnosis not present

## 2021-06-15 DIAGNOSIS — I129 Hypertensive chronic kidney disease with stage 1 through stage 4 chronic kidney disease, or unspecified chronic kidney disease: Secondary | ICD-10-CM

## 2021-06-15 DIAGNOSIS — Z0000 Encounter for general adult medical examination without abnormal findings: Secondary | ICD-10-CM

## 2021-06-15 DIAGNOSIS — G8929 Other chronic pain: Secondary | ICD-10-CM

## 2021-06-15 LAB — POCT URINALYSIS DIPSTICK
Bilirubin, UA: NEGATIVE
Glucose, UA: NEGATIVE
Ketones, UA: NEGATIVE
Nitrite, UA: NEGATIVE
Protein, UA: NEGATIVE
Spec Grav, UA: 1.01 (ref 1.010–1.025)
Urobilinogen, UA: 0.2 E.U./dL
pH, UA: 5 (ref 5.0–8.0)

## 2021-06-15 LAB — POCT UA - MICROALBUMIN
Albumin/Creatinine Ratio, Urine, POC: <30
Creatinine, POC: 200 mg/dL
Microalbumin Ur, POC: 30 mg/L

## 2021-06-15 LAB — POC HEMOCCULT BLD/STL (OFFICE/1-CARD/DIAGNOSTIC): Fecal Occult Blood, POC: NEGATIVE

## 2021-06-15 MED ORDER — ZOSTER VAC RECOMB ADJUVANTED 50 MCG/0.5ML IM SUSR: 0.5000 mL | Freq: Once | INTRAMUSCULAR | 0 refills | Status: AC

## 2021-06-16 LAB — CMP14+EGFR
ALT: 3 IU/L (ref 0–32)
AST: 8 IU/L (ref 0–40)
Albumin/Globulin Ratio: 1.1 — ABNORMAL LOW (ref 1.2–2.2)
Albumin: 3.9 g/dL (ref 3.8–4.9)
Alkaline Phosphatase: 75 IU/L (ref 44–121)
BUN/Creatinine Ratio: 14 (ref 9–23)
BUN: 18 mg/dL (ref 6–24)
Bilirubin Total: 0.3 mg/dL (ref 0.0–1.2)
CO2: 25 mmol/L (ref 20–29)
Calcium: 9.5 mg/dL (ref 8.7–10.2)
Chloride: 101 mmol/L (ref 96–106)
Creatinine, Ser: 1.32 mg/dL — ABNORMAL HIGH (ref 0.57–1.00)
Globulin, Total: 3.5 g/dL (ref 1.5–4.5)
Glucose: 97 mg/dL (ref 65–99)
Potassium: 4.1 mmol/L (ref 3.5–5.2)
Sodium: 138 mmol/L (ref 134–144)
Total Protein: 7.4 g/dL (ref 6.0–8.5)
eGFR: 48 mL/min/{1.73_m2} — ABNORMAL LOW (ref >59)

## 2021-06-16 LAB — CBC
Hematocrit: 34.5 % (ref 34.0–46.6)
Hemoglobin: 10.7 g/dL — ABNORMAL LOW (ref 11.1–15.9)
MCH: 24.5 pg — ABNORMAL LOW (ref 26.6–33.0)
MCHC: 31 g/dL — ABNORMAL LOW (ref 31.5–35.7)
MCV: 79 fL (ref 79–97)
Platelets: 279 10*3/uL (ref 150–450)
RBC: 4.37 x10E6/uL (ref 3.77–5.28)
RDW: 13.9 % (ref 11.7–15.4)
WBC: 5.3 10*3/uL (ref 3.4–10.8)

## 2021-06-16 LAB — HEMOGLOBIN A1C
Est. average glucose Bld gHb Est-mCnc: 131 mg/dL
Hgb A1c MFr Bld: 6.2 % — ABNORMAL HIGH (ref 4.8–5.6)

## 2021-06-17 LAB — CYTOLOGY - PAP
Comment: NEGATIVE
Diagnosis: NEGATIVE
High risk HPV: NEGATIVE

## 2021-06-22 ENCOUNTER — Other Ambulatory Visit: Payer: Self-pay

## 2021-06-22 ENCOUNTER — Ambulatory Visit: Payer: Self-pay

## 2021-06-22 ENCOUNTER — Encounter: Payer: Self-pay | Admitting: Orthopedic Surgery

## 2021-06-22 ENCOUNTER — Ambulatory Visit (INDEPENDENT_AMBULATORY_CARE_PROVIDER_SITE_OTHER): Payer: 59 | Admitting: Orthopedic Surgery

## 2021-06-22 DIAGNOSIS — G8929 Other chronic pain: Secondary | ICD-10-CM | POA: Diagnosis not present

## 2021-06-22 DIAGNOSIS — M25512 Pain in left shoulder: Secondary | ICD-10-CM | POA: Diagnosis not present

## 2021-06-24 ENCOUNTER — Ambulatory Visit: Payer: BLUE CROSS/BLUE SHIELD

## 2021-07-07 ENCOUNTER — Encounter: Payer: Self-pay | Admitting: Internal Medicine

## 2021-07-20 ENCOUNTER — Encounter: Payer: Self-pay | Admitting: Orthopedic Surgery

## 2021-07-20 ENCOUNTER — Ambulatory Visit (INDEPENDENT_AMBULATORY_CARE_PROVIDER_SITE_OTHER): Payer: 59 | Admitting: Orthopedic Surgery

## 2021-07-20 DIAGNOSIS — G8929 Other chronic pain: Secondary | ICD-10-CM | POA: Diagnosis not present

## 2021-07-20 DIAGNOSIS — M25512 Pain in left shoulder: Secondary | ICD-10-CM | POA: Diagnosis not present

## 2021-07-20 MED ORDER — METHYLPREDNISOLONE ACETATE 40 MG/ML IJ SUSP
40.0000 mg | INTRAMUSCULAR | Status: AC | PRN
  Administered 2021-07-20: 40 mg via INTRA_ARTICULAR

## 2021-07-20 MED ORDER — LIDOCAINE HCL 1 % IJ SOLN
5.0000 mL | INTRAMUSCULAR | Status: AC | PRN
  Administered 2021-07-20: 5 mL

## 2021-07-20 NOTE — Progress Notes (Signed)
Office Visit Note   Patient: Bridget Bates           Date of Birth: 05-25-1966           MRN: [id number] Visit Date: 06/22/2021              Requested by: Dorothyann Peng, MD [address] [place],  [place] [address] PCP: Dorothyann Peng, MD  Chief Complaint  Patient presents with   Left Shoulder - Pain    Pain for several months without injury      HPI: Patient is a 55 year old woman who presents with a several month history of left shoulder pain.  She has decreased range of motion unable to raise her hand above her head.  Denies any radicular symptoms into her hand.  Assessment & Plan: Visit Diagnoses:  1. Chronic left shoulder pain     Plan: Left shoulder was injected in subacromial space  Follow-Up Instructions: Return in about 4 weeks (around 07/20/2021).   Ortho Exam  Patient is alert, oriented, no adenopathy, well-dressed, normal affect, normal respiratory effort. Examination patient has abduction and flexion to 90 degrees she has pain with Neer and Hawkins impingement test the [facility] joint and biceps tendon are nontender to palpation.  Imaging: No results found. No images are attached to the encounter.  Labs: Lab Results  Component Value Date   HGBA1C 6.2 (H) 06/15/2021   HGBA1C 5.7 (H) 03/04/2021   HGBA1C 6.0 (H) 06/09/2020     Lab Results  Component Value Date   ALBUMIN 3.9 06/15/2021   ALBUMIN 4.0 03/04/2021   ALBUMIN 3.9 03/11/2020    No results found for: MG Lab Results  Component Value Date   VD25OH 28.7 (L) 06/09/2020   VD25OH 23.1 (L) 04/02/2019    No results found for: PREALBUMIN CBC EXTENDED Latest Ref Rng & Units 06/15/2021 03/04/2021 06/09/2020  WBC 3.4 - 10.8 x10E3/uL 5.3 5.8 5.1  RBC 3.77 - 5.28 x10E6/uL 4.37 4.26 4.11  HGB 11.1 - 15.9 g/dL 10.7(L) 10.4(L) 10.0(L)  HCT 34.0 - 46.6 % 34.5 33.4(L) 32.6(L)  PLT 150 - 450 x10E3/uL 279 233 241  NEUTROABS 1.7 - 7.7 K/uL - - -  LYMPHSABS 0.7 - 4.0 K/uL - - -     There is no  height or weight on file to calculate BMI.  Orders:  Orders Placed This Encounter  Procedures   XR Shoulder Left   No orders of the defined types were placed in this encounter.    Procedures: Large Joint Inj: L subacromial bursa on 07/20/2021 9:22 AM Indications: diagnostic evaluation and pain Details: 22 G 1.5 in needle, posterior approach  Arthrogram: No  Medications: 5 mL lidocaine 1 %; 40 mg methylPREDNISolone acetate 40 MG/ML Outcome: tolerated well, no immediate complications Procedure, treatment alternatives, risks and benefits explained, specific risks discussed. Consent was given by the patient. Immediately prior to procedure a time out was called to verify the correct patient, procedure, equipment, support staff and site/side marked as required. Patient was prepped and draped in the usual sterile fashion.     Clinical Data: No additional findings.  ROS:  All other systems negative, except as noted in the HPI. Review of Systems  Objective: Vital Signs: There were no vitals taken for this visit.  Specialty Comments:  No specialty comments available.  PMFS History: Patient Active Problem List   Diagnosis Date Noted   Routine general medical examination at health care facility 06/15/2021   Cervical smear, as part  of routine gynecological examination 06/15/2021   Hypertensive nephropathy 04/02/2019   Type 2 diabetes mellitus with stage 3a chronic kidney disease, without long-term current use of insulin (HCC) 04/02/2019   Past Medical History:  Diagnosis Date   Diabetes mellitus without complication (HCC)    Hypertension    Obesity     Family History  Problem Relation Age of Onset   Diabetes Mother    Healthy Father     Past Surgical History:  Procedure Laterality Date   TUBAL LIGATION  1989   Social History   Occupational History   Not on file  Tobacco Use   Smoking status: Never   Smokeless tobacco: Never  Vaping Use   Vaping Use: Never used   Substance and Sexual Activity   Alcohol use: No   Drug use: No   Sexual activity: Not Currently

## 2021-07-20 NOTE — Progress Notes (Signed)
Office Visit Note   Patient: Bridget Bates           Date of Birth: [birth year]           MRN: [id number] Visit Date: 07/20/2021              Requested by: Dorothyann Peng, MD [address] [place],  [place] [address] PCP: Dorothyann Peng, MD  Chief Complaint  Patient presents with   Left Shoulder - Follow-up    S/p injection 06/22/21      HPI: Patient is a 55 year old woman who presents 4 weeks status post subacromial injection left shoulder.  Patient states she feels great has no concerns or pain.  Assessment & Plan: Visit Diagnoses:  1. Chronic left shoulder pain     Plan: Patient will work on internal/external rotation strengthening and follow-up with [place] as needed.  Follow-Up Instructions: Return if symptoms worsen or fail to improve.   Ortho Exam  Patient is alert, oriented, no adenopathy, well-dressed, normal affect, normal respiratory effort. Examination patient has full range of motion of the left shoulder no pain with Neer or Hawkins impingement test.  The biceps tendon is nontender to palpation the [facility] joint is nontender to palpation.  Imaging: No results found. No images are attached to the encounter.  Labs: Lab Results  Component Value Date   HGBA1C 6.2 (H) 06/15/2021   HGBA1C 5.7 (H) 03/04/2021   HGBA1C 6.0 (H) 06/09/2020     Lab Results  Component Value Date   ALBUMIN 3.9 06/15/2021   ALBUMIN 4.0 03/04/2021   ALBUMIN 3.9 03/11/2020    No results found for: MG Lab Results  Component Value Date   VD25OH 28.7 (L) 06/09/2020   VD25OH 23.1 (L) 04/02/2019    No results found for: PREALBUMIN CBC EXTENDED Latest Ref Rng & Units 06/15/2021 03/04/2021 06/09/2020  WBC 3.4 - 10.8 x10E3/uL 5.3 5.8 5.1  RBC 3.77 - 5.28 x10E6/uL 4.37 4.26 4.11  HGB 11.1 - 15.9 g/dL 10.7(L) 10.4(L) 10.0(L)  HCT 34.0 - 46.6 % 34.5 33.4(L) 32.6(L)  PLT 150 - 450 x10E3/uL 279 233 241  NEUTROABS 1.7 - 7.7 K/uL - - -  LYMPHSABS 0.7 - 4.0 K/uL - - -     There is  no height or weight on file to calculate BMI.  Orders:  No orders of the defined types were placed in this encounter.  No orders of the defined types were placed in this encounter.    Procedures: No procedures performed  Clinical Data: No additional findings.  ROS:  All other systems negative, except as noted in the HPI. Review of Systems  Objective: Vital Signs: There were no vitals taken for this visit.  Specialty Comments:  No specialty comments available.  PMFS History: Patient Active Problem List   Diagnosis Date Noted   Routine general medical examination at health care facility 06/15/2021   Cervical smear, as part of routine gynecological examination 06/15/2021   Hypertensive nephropathy 04/02/2019   Type 2 diabetes mellitus with stage 3a chronic kidney disease, without long-term current use of insulin (HCC) 04/02/2019   Past Medical History:  Diagnosis Date   Diabetes mellitus without complication (HCC)    Hypertension    Obesity     Family History  Problem Relation Age of Onset   Diabetes Mother    Healthy Father     Past Surgical History:  Procedure Laterality Date   TUBAL LIGATION  1989   Social History   Occupational  History   Not on file  Tobacco Use   Smoking status: Never   Smokeless tobacco: Never  Vaping Use   Vaping Use: Never used  Substance and Sexual Activity   Alcohol use: No   Drug use: No   Sexual activity: Not Currently

## 2021-07-24 ENCOUNTER — Ambulatory Visit
Admission: RE | Admit: 2021-07-24 | Discharge: 2021-07-24 | Disposition: A | Discharge status: Home / Self Care | Payer: 59 | Source: Ambulatory Visit | Attending: Internal Medicine | Admitting: Internal Medicine

## 2021-07-24 ENCOUNTER — Other Ambulatory Visit: Payer: Self-pay

## 2021-07-24 DIAGNOSIS — Z1231 Encounter for screening mammogram for malignant neoplasm of breast: Secondary | ICD-10-CM

## 2021-08-05 ENCOUNTER — Encounter: Payer: Self-pay | Admitting: Internal Medicine

## 2021-08-12 ENCOUNTER — Other Ambulatory Visit: Payer: Self-pay | Admitting: Internal Medicine

## 2021-08-12 DIAGNOSIS — E1129 Type 2 diabetes mellitus with other diabetic kidney complication: Secondary | ICD-10-CM

## 2021-08-12 DIAGNOSIS — I129 Hypertensive chronic kidney disease with stage 1 through stage 4 chronic kidney disease, or unspecified chronic kidney disease: Secondary | ICD-10-CM

## 2021-08-12 DIAGNOSIS — N1831 Chronic kidney disease, stage 3a: Secondary | ICD-10-CM

## 2021-08-23 ENCOUNTER — Other Ambulatory Visit: Payer: Self-pay | Admitting: Internal Medicine

## 2021-08-24 ENCOUNTER — Ambulatory Visit
Admission: RE | Admit: 2021-08-24 | Discharge: 2021-08-24 | Disposition: A | Discharge status: Home / Self Care | Payer: 59 | Source: Ambulatory Visit | Attending: Internal Medicine | Admitting: Internal Medicine

## 2021-08-24 DIAGNOSIS — I129 Hypertensive chronic kidney disease with stage 1 through stage 4 chronic kidney disease, or unspecified chronic kidney disease: Secondary | ICD-10-CM

## 2021-08-24 DIAGNOSIS — N1831 Chronic kidney disease, stage 3a: Secondary | ICD-10-CM

## 2021-08-24 DIAGNOSIS — E1129 Type 2 diabetes mellitus with other diabetic kidney complication: Secondary | ICD-10-CM

## 2021-08-27 ENCOUNTER — Other Ambulatory Visit: Payer: Self-pay | Admitting: Internal Medicine

## 2021-10-02 ENCOUNTER — Other Ambulatory Visit: Payer: Self-pay | Admitting: Internal Medicine

## 2021-10-13 ENCOUNTER — Telehealth: Payer: Self-pay

## 2021-10-13 NOTE — Telephone Encounter (Signed)
The pt was notified that samples of ryblesus is ready for pickup.

## 2021-10-22 ENCOUNTER — Encounter: Payer: Self-pay | Admitting: Internal Medicine

## 2021-10-22 ENCOUNTER — Ambulatory Visit: Payer: 59 | Admitting: Internal Medicine

## 2021-10-22 ENCOUNTER — Other Ambulatory Visit: Payer: Self-pay

## 2021-10-22 VITALS — BP 110/70 | HR 78 | Temp 98.4°F | Ht 60.6 in | Wt 170.8 lb

## 2021-10-22 DIAGNOSIS — Z23 Encounter for immunization: Secondary | ICD-10-CM

## 2021-10-22 DIAGNOSIS — I129 Hypertensive chronic kidney disease with stage 1 through stage 4 chronic kidney disease, or unspecified chronic kidney disease: Secondary | ICD-10-CM | POA: Diagnosis not present

## 2021-10-22 DIAGNOSIS — E6609 Other obesity due to excess calories: Secondary | ICD-10-CM | POA: Diagnosis not present

## 2021-10-22 DIAGNOSIS — Z6832 Body mass index (BMI) 32.0-32.9, adult: Secondary | ICD-10-CM

## 2021-10-22 DIAGNOSIS — N1831 Chronic kidney disease, stage 3a: Secondary | ICD-10-CM

## 2021-10-22 DIAGNOSIS — Z2821 Immunization not carried out because of patient refusal: Secondary | ICD-10-CM

## 2021-10-22 DIAGNOSIS — E1122 Type 2 diabetes mellitus with diabetic chronic kidney disease: Secondary | ICD-10-CM | POA: Diagnosis not present

## 2021-10-22 MED ORDER — SIMVASTATIN 10 MG PO TABS: ORAL_TABLET | ORAL | 2 refills | Status: DC

## 2021-10-22 MED ORDER — TELMISARTAN 40 MG PO TABS: 40.0000 mg | ORAL_TABLET | Freq: Every day | ORAL | 2 refills | Status: DC

## 2021-10-22 MED ORDER — METFORMIN HCL 500 MG PO TABS: 500.0000 mg | ORAL_TABLET | Freq: Every day | ORAL | 1 refills | Status: DC

## 2021-10-22 MED ORDER — TETANUS-DIPHTH-ACELL PERTUSSIS 5-2.5-18.5 LF-MCG/0.5 IM SUSY
0.5000 mL | PREFILLED_SYRINGE | Freq: Once | INTRAMUSCULAR | Status: AC
  Administered 2021-10-22: 0.5 mL via INTRAMUSCULAR

## 2021-10-22 NOTE — Patient Instructions (Signed)

## 2021-10-22 NOTE — Addendum Note (Signed)
Addended by: Maximino Greenland on: 10/22/2021 11:10 AM   Modules accepted: Orders

## 2021-10-22 NOTE — Progress Notes (Addendum)
[name],acting as a Education administrator for Maximino Greenland, MD.,have documented all relevant documentation on the behalf of Maximino Greenland, MD,as directed by  Maximino Greenland, MD while in the presence of Maximino Greenland, MD.  This visit occurred during the SARS-CoV-2 public health emergency.  Safety protocols were in place, including screening questions prior to the visit, additional usage of staff PPE, and extensive cleaning of exam room while observing appropriate contact time as indicated for disinfecting solutions.  Subjective:     Patient ID: Bridget Bates , female    DOB: Feb 01, 1966 , 56 y.o.   MRN: [id number]   Chief Complaint  Patient presents with   Hypertension    HPI  The patient is here today for a follow-up on her diabetes and blood pressure.  She reports compliance with meds. She denies headaches, chest pain and shortness of breath.   Hypertension This is a chronic problem. The current episode started more than 1 year ago. The problem has been gradually improving since onset. The problem is uncontrolled. Pertinent negatives include no blurred vision, chest pain, palpitations or shortness of breath. The current treatment provides moderate improvement.  Diabetes She presents for her follow-up diabetic visit. She has type 2 diabetes mellitus. Her disease course has been stable. There are no hypoglycemic associated symptoms. Pertinent negatives for diabetes include no blurred vision and no chest pain. There are no hypoglycemic complications. Risk factors for coronary artery disease include diabetes mellitus, dyslipidemia, hypertension, sedentary lifestyle and obesity. She never participates in exercise. Her breakfast blood glucose is taken between 8-9 am. Her breakfast blood glucose range is generally 110-130 mg/dl. An ACE inhibitor/angiotensin II receptor blocker is being taken. Eye exam is not current.    Past Medical History:  Diagnosis Date   Diabetes mellitus without  complication ([place])    Hypertension    Obesity      Family History  Problem Relation Age of Onset   Diabetes Mother    Healthy Father      Current Outpatient Medications:    chlorthalidone (HYGROTON) 25 MG tablet, TAKE 1 TABLET BY MOUTH MONDAY, WEDNESDAY, FRIDAY ONLY, Disp: 90 tablet, Rfl: 1   cyclobenzaprine (FLEXERIL) 5 MG tablet, Take 1 tablet (5 mg total) by mouth 3 (three) times daily as needed for muscle spasms., Disp: 30 tablet, Rfl: 0   glucose blood (ONETOUCH VERIO) test strip, Use as instructed to check blood sugars dx code- e11.65, Disp: 100 each, Rfl: 5   Lancets (ONETOUCH ULTRASOFT) lancets, Use as instructed, Disp: 100 each, Rfl: 12   RYBELSUS 14 MG TABS, TAKE 1 TABLET BY MOUTH DAILY, Disp: 30 tablet, Rfl: 2   telmisartan (MICARDIS) 40 MG tablet, Take 1 tablet (40 mg total) by mouth daily., Disp: 90 tablet, Rfl: 2   metFORMIN (GLUCOPHAGE) 500 MG tablet, Take 1 tablet (500 mg total) by mouth daily with breakfast., Disp: 90 tablet, Rfl: 1   simvastatin (ZOCOR) 10 MG tablet, TAKE 1 TABLET BY MOUTH EVERY DAY IN THE EVENING, Disp: 90 tablet, Rfl: 2   Allergies  Allergen Reactions   Amoxicillin Hives and Itching     Review of Systems  Constitutional: Negative.   Eyes:  Negative for blurred vision.  Respiratory: Negative.  Negative for shortness of breath.   Cardiovascular: Negative.  Negative for chest pain and palpitations.  Neurological: Negative.   Psychiatric/Behavioral: Negative.      Today's Vitals   10/22/21 1009  BP: 110/70  Pulse: 78  Temp: 98.4 F (  36.9 C)  Weight: 170 lb 12.8 oz (77.5 kg)  Height: 5' 0.6" (1.539 m)  PainSc: 0-No pain   Body mass index is 32.7 kg/m.  Wt Readings from Last 3 Encounters:  10/22/21 170 lb 12.8 oz (77.5 kg)  06/15/21 164 lb 9.6 oz (74.7 kg)  03/04/21 164 lb (74.4 kg)     Objective:  Physical Exam Vitals and nursing note reviewed.  Constitutional:      Appearance: Normal appearance. She is obese.  HENT:     Head:  Normocephalic and atraumatic.     Nose:     Comments: Masked     Mouth/Throat:     Comments: Masked  Eyes:     Extraocular Movements: Extraocular movements intact.  Cardiovascular:     Rate and Rhythm: Normal rate and regular rhythm.     Heart sounds: Normal heart sounds.  Pulmonary:     Effort: Pulmonary effort is normal.     Breath sounds: Normal breath sounds.  Musculoskeletal:     Cervical back: Normal range of motion.  Skin:    General: Skin is warm.  Neurological:     General: No focal deficit present.     Mental Status: She is alert.  Psychiatric:        Mood and Affect: Mood normal.        Behavior: Behavior normal.     Assessment And Plan:     1. Hypertensive nephropathy Comments: Chronic, well controlled. Telmisartan decreased to 41m daily by Renal. No med changes.   2. Type 2 diabetes mellitus with stage 3a chronic kidney disease, without long-term current use of insulin (HCC) Comments: Chronic, I will check labs as below. She will c/w Rybelsus 161mdaily, metformin 5003maily. She will rto in 4 months for re-evaluation.  - Hemoglobin A1c - CMP14+EGFR - Protein electrophoresis, serum  3. Class 1 obesity due to excess calories with serious comorbidity and body mass index (BMI) of 32.0 to 32.9 in adult Comments: She was made aware of 6 lb weight gain. She is encouraged to resume her regular exercise regimen, aiming for 150 min exercise/week.   4. Immunization due Comments: She agrees to Tdap.  - Tdap (BOOSTRIX) injection 0.5 mL  5. Herpes zoster vaccination declined   Patient was given opportunity to ask questions. Patient verbalized understanding of the plan and was able to repeat key elements of the plan. All questions were answered to their satisfaction.   I, RobMaximino GreenlandD, have reviewed all documentation for this visit. The documentation on 10/22/21 for the exam, diagnosis, procedures, and orders are all accurate and complete.   IF YOU HAVE BEEN  REFERRED TO A SPECIALIST, IT MAY TAKE 1-2 WEEKS TO SCHEDULE/PROCESS THE REFERRAL. IF YOU HAVE NOT HEARD FROM US/SPECIALIST IN TWO WEEKS, PLEASE GIVE US [place] AT [phone] X [phone].   THE PATIENT IS ENCOURAGED TO PRACTICE SOCIAL DISTANCING DUE TO THE COVID-19 PANDEMIC.

## 2021-10-26 LAB — HEMOGLOBIN A1C
Est. average glucose Bld gHb Est-mCnc: 131 mg/dL
Hgb A1c MFr Bld: 6.2 % — ABNORMAL HIGH (ref 4.8–5.6)

## 2021-10-26 LAB — CMP14+EGFR
ALT: 5 IU/L (ref 0–32)
AST: 15 IU/L (ref 0–40)
Albumin/Globulin Ratio: 1.2 (ref 1.2–2.2)
Albumin: 3.8 g/dL (ref 3.8–4.9)
Alkaline Phosphatase: 82 IU/L (ref 44–121)
BUN/Creatinine Ratio: 18 (ref 9–23)
BUN: 24 mg/dL (ref 6–24)
Bilirubin Total: 0.3 mg/dL (ref 0.0–1.2)
CO2: 26 mmol/L (ref 20–29)
Calcium: 9.6 mg/dL (ref 8.7–10.2)
Chloride: 100 mmol/L (ref 96–106)
Creatinine, Ser: 1.31 mg/dL — ABNORMAL HIGH (ref 0.57–1.00)
Globulin, Total: 3.3 g/dL (ref 1.5–4.5)
Glucose: 84 mg/dL (ref 70–99)
Potassium: 4.5 mmol/L (ref 3.5–5.2)
Sodium: 139 mmol/L (ref 134–144)
Total Protein: 7.1 g/dL (ref 6.0–8.5)
eGFR: 48 mL/min/{1.73_m2} — ABNORMAL LOW (ref >59)

## 2021-10-26 LAB — PROTEIN ELECTROPHORESIS, SERUM
A/G Ratio: 0.9 (ref 0.7–1.7)
Albumin ELP: 3.3 g/dL (ref 2.9–4.4)
Alpha 1: 0.2 g/dL (ref 0.0–0.4)
Alpha 2: 0.8 g/dL (ref 0.4–1.0)
Beta: 1.2 g/dL (ref 0.7–1.3)
Gamma Globulin: 1.6 g/dL (ref 0.4–1.8)
Globulin, Total: 3.8 g/dL (ref 2.2–3.9)

## 2021-11-23 ENCOUNTER — Other Ambulatory Visit: Payer: Self-pay | Admitting: Internal Medicine

## 2021-12-05 LAB — HM DIABETES EYE EXAM

## 2021-12-18 ENCOUNTER — Other Ambulatory Visit: Payer: Self-pay | Admitting: Internal Medicine

## 2022-02-12 LAB — BASIC METABOLIC PANEL
BUN: 31 — AB (ref 4–21)
CO2: 29 — AB (ref 13–22)
Chloride: 100 (ref 99–108)
Creatinine: 1.3 — AB (ref 0.5–1.1)
Glucose: 93
Potassium: 4 mEq/L (ref 3.5–5.1)
Sodium: 138 (ref 137–147)

## 2022-02-12 LAB — COMPREHENSIVE METABOLIC PANEL
Albumin: 4 (ref 3.5–5.0)
Calcium: 9.6 (ref 8.7–10.7)
eGFR: 48

## 2022-02-21 IMAGING — MG MM DIGITAL SCREENING BILAT W/ TOMO AND CAD
8 series · 8 of 24 positions shown · non-contrast
Comparison: Previous exam(s).

CLINICAL DATA: Screening.

EXAM:
DIGITAL SCREENING BILATERAL MAMMOGRAM WITH TOMOSYNTHESIS AND CAD
TECHNIQUE: Bilateral screening digital craniocaudal and mediolateral oblique
mammograms were obtained. Bilateral screening digital breast
tomosynthesis was performed. The images were evaluated with
computer-aided detection.

[L CC synth-2D]
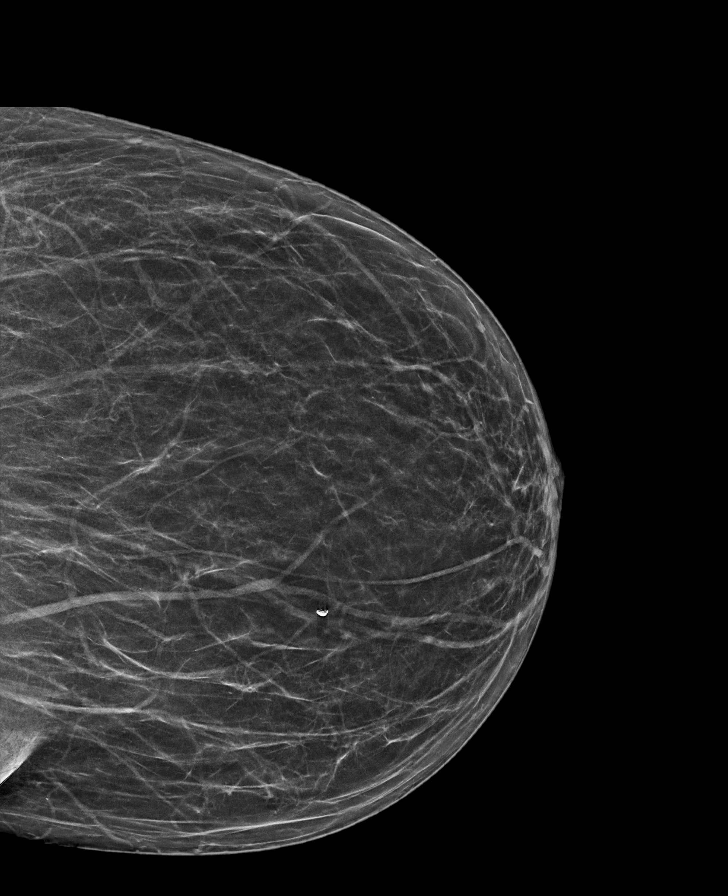

[L MLO synth-2D]
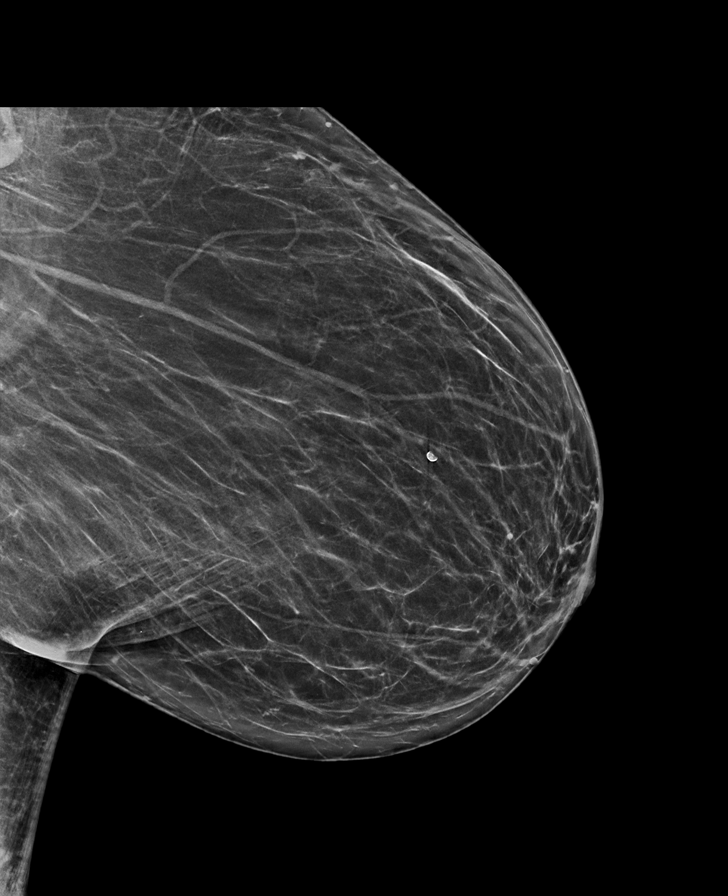

[R MLO synth-2D]
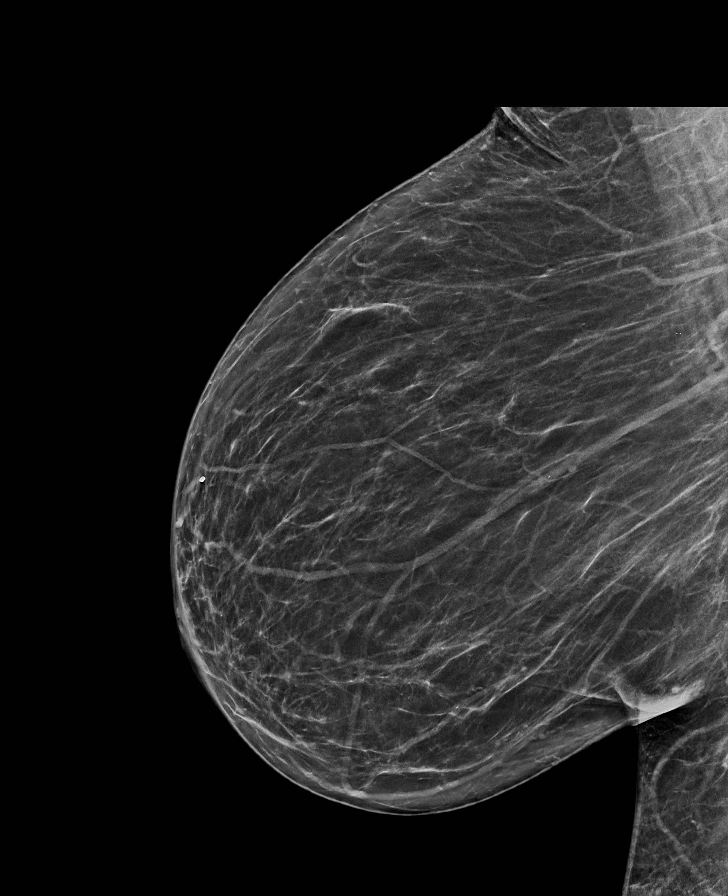

[R CC synth-2D]
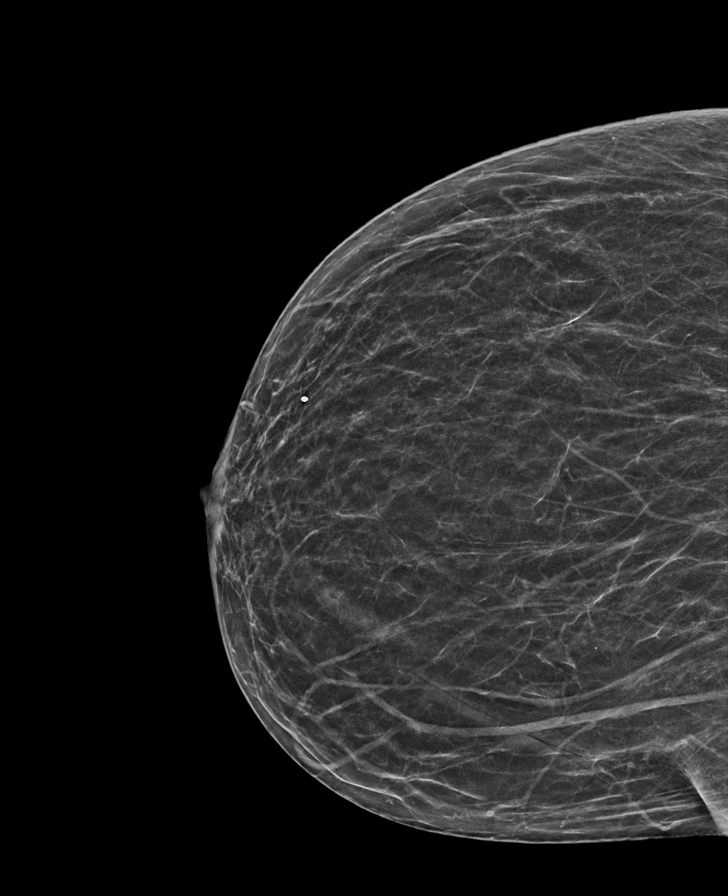

[R MLO tomo · tomo slice 32/63.0]
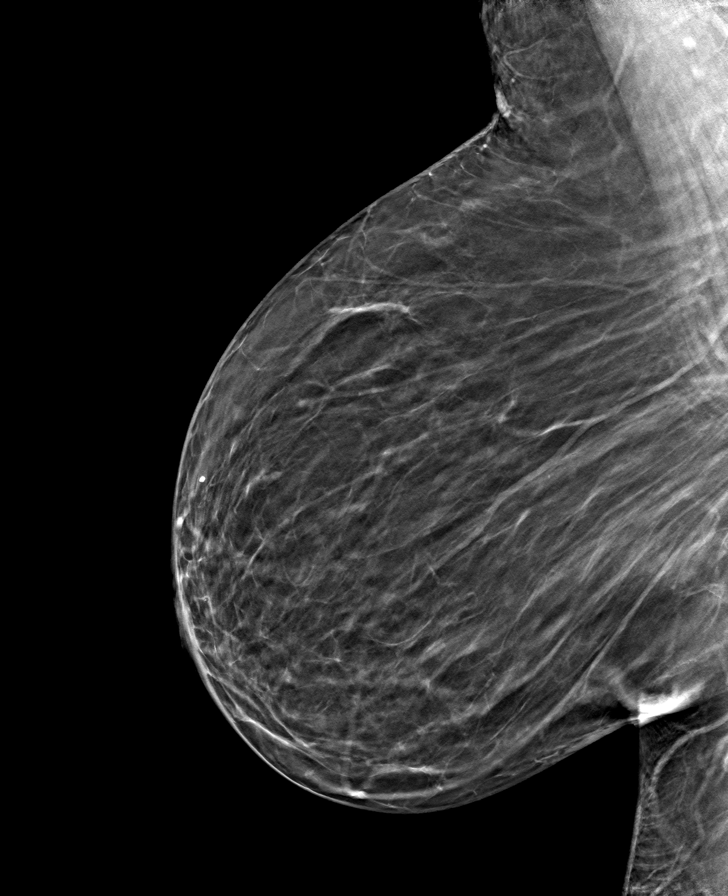

[L MLO tomo · tomo slice 35/68.0]
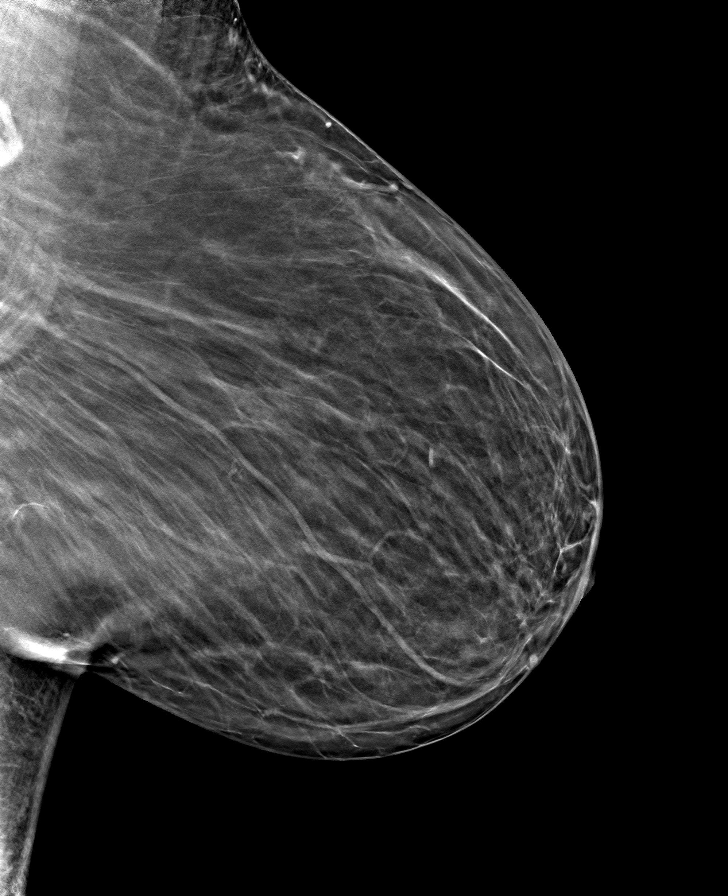

[L CC tomo · tomo slice 29/57.0]
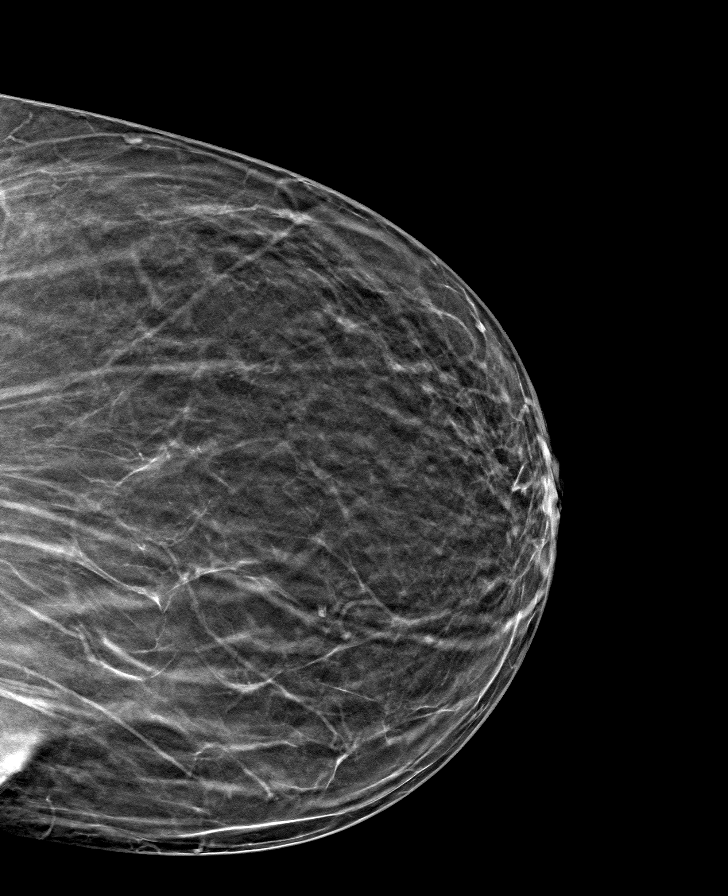

[R CC tomo · tomo slice 27/52.0]
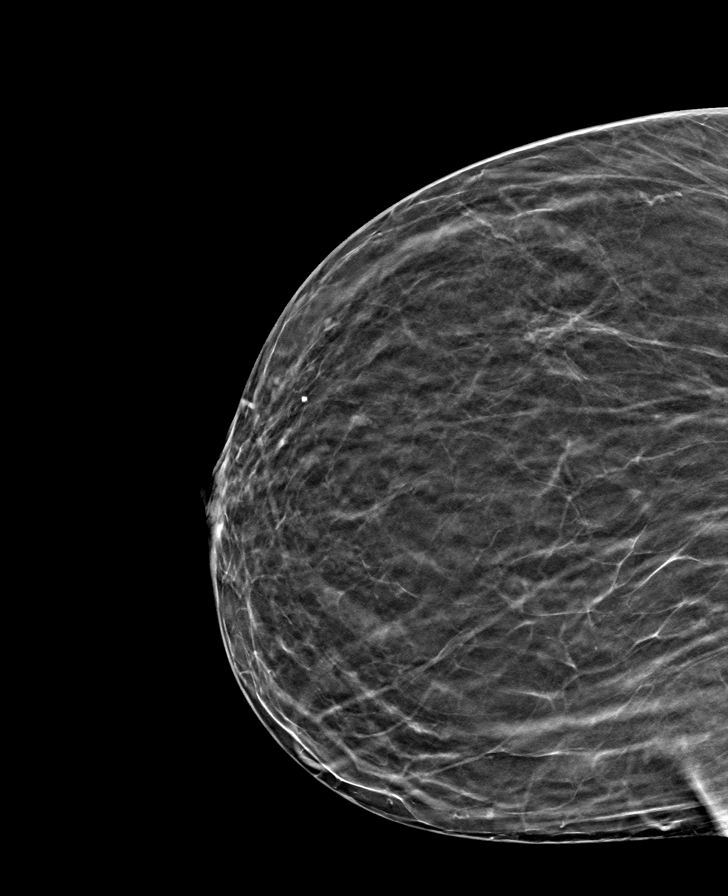

[8 of 24 positions shown; findings below may reference images not displayed]

ACR Breast Density Category b: There are scattered areas of
fibroglandular density.
FINDINGS: There are no findings suspicious for malignancy.
IMPRESSION: No mammographic evidence of malignancy. A result letter of this
screening mammogram will be mailed directly to the patient.

RECOMMENDATION:
Screening mammogram in one year. (Code:51-O-LD2)

BI-RADS CATEGORY  1: Negative.

## 2022-02-24 ENCOUNTER — Ambulatory Visit (INDEPENDENT_AMBULATORY_CARE_PROVIDER_SITE_OTHER): Payer: 59 | Admitting: Internal Medicine

## 2022-02-24 ENCOUNTER — Encounter: Payer: Self-pay | Admitting: Internal Medicine

## 2022-02-24 VITALS — BP 116/78 | HR 86 | Temp 98.2°F | Ht 60.6 in | Wt 172.4 lb

## 2022-02-24 DIAGNOSIS — N1831 Chronic kidney disease, stage 3a: Secondary | ICD-10-CM

## 2022-02-24 DIAGNOSIS — I129 Hypertensive chronic kidney disease with stage 1 through stage 4 chronic kidney disease, or unspecified chronic kidney disease: Secondary | ICD-10-CM | POA: Diagnosis not present

## 2022-02-24 DIAGNOSIS — E6609 Other obesity due to excess calories: Secondary | ICD-10-CM

## 2022-02-24 DIAGNOSIS — E1122 Type 2 diabetes mellitus with diabetic chronic kidney disease: Secondary | ICD-10-CM | POA: Diagnosis not present

## 2022-02-24 DIAGNOSIS — Z2821 Immunization not carried out because of patient refusal: Secondary | ICD-10-CM

## 2022-02-24 DIAGNOSIS — Z6833 Body mass index (BMI) 33.0-33.9, adult: Secondary | ICD-10-CM

## 2022-02-24 MED ORDER — RYBELSUS 14 MG PO TABS: 1.0000 | ORAL_TABLET | Freq: Every day | ORAL | 2 refills | Status: DC

## 2022-02-24 NOTE — Patient Instructions (Signed)

## 2022-02-24 NOTE — Progress Notes (Unsigned)
Bridget Bates,acting as a Education administrator for Bridget Greenland, MD.,have documented all relevant documentation on the behalf of Bridget Greenland, MD,as directed by  Bridget Greenland, MD while in the presence of Bridget Greenland, MD.  This visit occurred during the SARS-CoV-2 public health emergency.  Safety protocols were in place, including screening questions prior to the visit, additional usage of staff PPE, and extensive cleaning of exam room while observing appropriate contact time as indicated for disinfecting solutions.  Subjective:     Patient ID: Bridget Bates , female    DOB: 05/12/1966 , 56 y.o.   MRN: [id number]   Chief Complaint  Patient presents with   Diabetes   Hypertension    HPI  The patient is here today for a follow-up on her diabetes and blood pressure.  She reports compliance with meds. She denies headaches, chest pain and shortness of breath.   Diabetes She presents for her follow-up diabetic visit. She has type 2 diabetes mellitus. Her disease course has been stable. There are no hypoglycemic associated symptoms. Pertinent negatives for diabetes include no blurred vision, no chest pain, no polydipsia, no polyphagia and no polyuria. There are no hypoglycemic complications. Risk factors for coronary artery disease include diabetes mellitus, dyslipidemia, hypertension, sedentary lifestyle and obesity. She never participates in exercise. Her breakfast blood glucose is taken between 8-9 am. Her breakfast blood glucose range is generally 110-130 mg/dl. An ACE inhibitor/angiotensin II receptor blocker is being taken. Eye exam is not current.  Hypertension This is a chronic problem. The current episode started more than 1 year ago. The problem has been gradually improving since onset. The problem is uncontrolled. Pertinent negatives include no blurred vision, chest pain, palpitations or shortness of breath. The current treatment provides moderate improvement.    Past Medical  History:  Diagnosis Date   Diabetes mellitus without complication ([place])    Hypertension    Obesity      Family History  Problem Relation Age of Onset   Diabetes Mother    Healthy Father      Current Outpatient Medications:    chlorthalidone (HYGROTON) 25 MG tablet, TAKE 1 TABLET BY MOUTH MONDAY, WEDNESDAY, FRIDAY ONLY, Disp: 90 tablet, Rfl: 1   cyclobenzaprine (FLEXERIL) 5 MG tablet, Take 1 tablet (5 mg total) by mouth 3 (three) times daily as needed for muscle spasms., Disp: 30 tablet, Rfl: 0   glucose blood (ONETOUCH VERIO) test strip, Use as instructed to check blood sugars dx code- e11.65, Disp: 100 each, Rfl: 5   Lancets (ONETOUCH ULTRASOFT) lancets, Use as instructed, Disp: 100 each, Rfl: 12   metFORMIN (GLUCOPHAGE) 500 MG tablet, Take 1 tablet (500 mg total) by mouth daily with breakfast., Disp: 90 tablet, Rfl: 1   simvastatin (ZOCOR) 10 MG tablet, TAKE 1 TABLET BY MOUTH EVERY DAY IN THE EVENING, Disp: 90 tablet, Rfl: 2   telmisartan (MICARDIS) 40 MG tablet, Take 1 tablet (40 mg total) by mouth daily., Disp: 90 tablet, Rfl: 2   Semaglutide (RYBELSUS) 14 MG TABS, Take 1 tablet (14 mg total) by mouth daily., Disp: 90 tablet, Rfl: 2   Allergies  Allergen Reactions   Amoxicillin Hives and Itching     Review of Systems  Constitutional: Negative.   Eyes:  Negative for blurred vision.  Respiratory: Negative.  Negative for shortness of breath.   Cardiovascular: Negative.  Negative for chest pain and palpitations.  Endocrine: Negative for polydipsia, polyphagia and polyuria.  Neurological: Negative.   Psychiatric/Behavioral: Negative.  Today's Vitals   02/24/22 1506  BP: 116/78  Pulse: 86  Temp: 98.2 F (36.8 C)  Weight: 172 lb 6.4 oz (78.2 kg)  Height: 5' 0.6" (1.539 m)  PainSc: 0-No pain   Body mass index is 33.01 kg/m.  Wt Readings from Last 3 Encounters:  02/24/22 172 lb 6.4 oz (78.2 kg)  10/22/21 170 lb 12.8 oz (77.5 kg)  06/15/21 164 lb 9.6 oz (74.7 kg)      Objective:  Physical Exam Vitals and nursing note reviewed.  Constitutional:      Appearance: Normal appearance.  HENT:     Head: Normocephalic and atraumatic.  Eyes:     Extraocular Movements: Extraocular movements intact.  Cardiovascular:     Rate and Rhythm: Normal rate and regular rhythm.     Heart sounds: Normal heart sounds.  Pulmonary:     Effort: Pulmonary effort is normal.     Breath sounds: Normal breath sounds.  Musculoskeletal:     Cervical back: Normal range of motion.  Skin:    General: Skin is warm.  Neurological:     General: No focal deficit present.     Mental Status: She is alert.  Psychiatric:        Mood and Affect: Mood normal.        Behavior: Behavior normal.        Assessment And Plan:     1. Type 2 diabetes mellitus with stage 3a chronic kidney disease, without long-term current use of insulin (HCC) Comments: Chronic, I will check a1c today. I will request recent Renal labs and abstract into her chart. Reminded to avoid NSAIDs. F/u in four months.  - Hemoglobin A1c  2. Hypertensive nephropathy Comments: Chronic, well controlled. She is encouraged to follow low sodium diet.  - BMP8+EGFR  3. Class 1 obesity due to excess calories with serious comorbidity and body mass index (BMI) of 33.0 to 33.9 in adult Comments: She is aware of 8lb weight gain. Encouraged to incorporate more exercise into her daily routine.   4. Herpes zoster vaccination declined   Patient was given opportunity to ask questions. Patient verbalized understanding of the plan and was able to repeat key elements of the plan. All questions were answered to their satisfaction.   I, Bridget Greenland, MD, have reviewed all documentation for this visit. The documentation on 02/24/22 for the exam, diagnosis, procedures, and orders are all accurate and complete.   IF YOU HAVE BEEN REFERRED TO A SPECIALIST, IT MAY TAKE 1-2 WEEKS TO SCHEDULE/PROCESS THE REFERRAL. IF YOU HAVE NOT HEARD  FROM US/SPECIALIST IN TWO WEEKS, PLEASE GIVE [place] A CALL AT [phone].   THE PATIENT IS ENCOURAGED TO PRACTICE SOCIAL DISTANCING DUE TO THE COVID-19 PANDEMIC.

## 2022-02-25 LAB — BMP8+EGFR
BUN/Creatinine Ratio: 17 (ref 9–23)
BUN: 24 mg/dL (ref 6–24)
CO2: 25 mmol/L (ref 20–29)
Calcium: 9.3 mg/dL (ref 8.7–10.2)
Chloride: 101 mmol/L (ref 96–106)
Creatinine, Ser: 1.44 mg/dL — ABNORMAL HIGH (ref 0.57–1.00)
Glucose: 87 mg/dL (ref 70–99)
Potassium: 4.4 mmol/L (ref 3.5–5.2)
Sodium: 139 mmol/L (ref 134–144)
eGFR: 43 mL/min/{1.73_m2} — ABNORMAL LOW (ref >59)

## 2022-02-25 LAB — HEMOGLOBIN A1C
Est. average glucose Bld gHb Est-mCnc: 123 mg/dL
Hgb A1c MFr Bld: 5.9 % — ABNORMAL HIGH (ref 4.8–5.6)

## 2022-02-26 ENCOUNTER — Encounter: Payer: Self-pay | Admitting: Internal Medicine

## 2022-03-08 ENCOUNTER — Encounter: Payer: Self-pay | Admitting: Internal Medicine

## 2022-04-14 ENCOUNTER — Other Ambulatory Visit: Payer: Self-pay | Admitting: Internal Medicine

## 2022-04-18 ENCOUNTER — Other Ambulatory Visit: Payer: Self-pay | Admitting: Internal Medicine

## 2022-04-26 ENCOUNTER — Ambulatory Visit: Payer: 59 | Admitting: Internal Medicine

## 2022-06-23 ENCOUNTER — Encounter: Payer: Self-pay | Admitting: Internal Medicine

## 2022-06-23 ENCOUNTER — Ambulatory Visit (INDEPENDENT_AMBULATORY_CARE_PROVIDER_SITE_OTHER): Payer: Commercial Managed Care - HMO | Admitting: Internal Medicine

## 2022-06-23 VITALS — BP 112/70 | HR 81 | Temp 98.2°F | Ht 60.6 in | Wt 175.0 lb

## 2022-06-23 DIAGNOSIS — I129 Hypertensive chronic kidney disease with stage 1 through stage 4 chronic kidney disease, or unspecified chronic kidney disease: Secondary | ICD-10-CM

## 2022-06-23 DIAGNOSIS — E1122 Type 2 diabetes mellitus with diabetic chronic kidney disease: Secondary | ICD-10-CM | POA: Diagnosis not present

## 2022-06-23 DIAGNOSIS — N1831 Chronic kidney disease, stage 3a: Secondary | ICD-10-CM

## 2022-06-23 DIAGNOSIS — Z23 Encounter for immunization: Secondary | ICD-10-CM

## 2022-06-23 DIAGNOSIS — Z0000 Encounter for general adult medical examination without abnormal findings: Secondary | ICD-10-CM

## 2022-06-23 DIAGNOSIS — Z2821 Immunization not carried out because of patient refusal: Secondary | ICD-10-CM

## 2022-06-23 LAB — POCT URINALYSIS DIPSTICK
Bilirubin, UA: NEGATIVE
Blood, UA: NEGATIVE
Glucose, UA: NEGATIVE
Ketones, UA: NEGATIVE
Nitrite, UA: NEGATIVE
Protein, UA: NEGATIVE
Spec Grav, UA: <=1.005 — AB (ref 1.010–1.025)
Urobilinogen, UA: 0.2 E.U./dL
pH, UA: 5.5 (ref 5.0–8.0)

## 2022-06-23 NOTE — Patient Instructions (Signed)

## 2022-06-23 NOTE — Progress Notes (Signed)
[name],acting as a Education administrator for Maximino Greenland, MD.,have documented all relevant documentation on the behalf of Maximino Greenland, MD,as directed by  Maximino Greenland, MD while in the presence of Maximino Greenland, MD.   Subjective:     Patient ID: Bridget Bates , female    DOB: 1965/12/02 , 56 y.o.   MRN: [id number]   Chief Complaint  Patient presents with   Annual Exam   Diabetes   Hypertension    HPI  She is here today for a full physical examination. Patient does not have a GYN provider. Her last pap smear was in 2022, this was normal. She reports compliance with meds. She denies headaches, chest pain and shortness of breath.   Diabetes She presents for her follow-up diabetic visit. She has type 2 diabetes mellitus. Her disease course has been stable. There are no hypoglycemic associated symptoms. Pertinent negatives for diabetes include no blurred vision. There are no hypoglycemic complications. Risk factors for coronary artery disease include diabetes mellitus, dyslipidemia, hypertension, sedentary lifestyle and obesity. She never participates in exercise. Her breakfast blood glucose is taken between 8-9 am. Her breakfast blood glucose range is generally 110-130 mg/dl. An ACE inhibitor/angiotensin II receptor blocker is being taken. Eye exam is not current.  Hypertension This is a chronic problem. The current episode started more than 1 year ago. The problem has been gradually improving since onset. The problem is uncontrolled. Pertinent negatives include no blurred vision.     Past Medical History:  Diagnosis Date   Diabetes mellitus without complication ([place])    Hypertension    Obesity      Family History  Problem Relation Age of Onset   Diabetes Mother    Healthy Father      Current Outpatient Medications:    chlorthalidone (HYGROTON) 25 MG tablet, TAKE 1 TABLET BY MOUTH ON MONDAY, WEDNESDAY, FRIDAY ONLY, Disp: 90 tablet, Rfl: 1   cyclobenzaprine (FLEXERIL) 5  MG tablet, Take 1 tablet (5 mg total) by mouth 3 (three) times daily as needed for muscle spasms., Disp: 30 tablet, Rfl: 0   glucose blood (ONETOUCH VERIO) test strip, Use as instructed to check blood sugars dx code- e11.65, Disp: 100 each, Rfl: 5   Lancets (ONETOUCH ULTRASOFT) lancets, Use as instructed, Disp: 100 each, Rfl: 12   metFORMIN (GLUCOPHAGE) 500 MG tablet, TAKE 1 TABLET(500 MG) BY MOUTH DAILY WITH BREAKFAST, Disp: 90 tablet, Rfl: 1   Semaglutide (RYBELSUS) 14 MG TABS, Take 1 tablet (14 mg total) by mouth daily., Disp: 90 tablet, Rfl: 2   simvastatin (ZOCOR) 10 MG tablet, TAKE 1 TABLET BY MOUTH EVERY DAY IN THE EVENING, Disp: 90 tablet, Rfl: 2   telmisartan (MICARDIS) 40 MG tablet, Take 1 tablet (40 mg total) by mouth daily., Disp: 90 tablet, Rfl: 2   Allergies  Allergen Reactions   Amoxicillin Hives and Itching      The patient states she uses post menopausal status for birth control. Last LMP was No LMP recorded. (Menstrual status: Perimenopausal).. Negative for Dysmenorrhea. Negative for: breast discharge, breast lump(s), breast pain and breast self exam. Associated symptoms include abnormal vaginal bleeding. Pertinent negatives include abnormal bleeding (hematology), anxiety, decreased libido, depression, difficulty falling sleep, dyspareunia, history of infertility, nocturia, sexual dysfunction, sleep disturbances, urinary incontinence, urinary urgency, vaginal discharge and vaginal itching. Diet regular.The patient states her exercise level is  intermittent.  . The patient's tobacco use is:  Social History   Tobacco Use  Smoking Status  Never  Smokeless Tobacco Never  . She has been exposed to passive smoke. The patient's alcohol use is:  Social History   Substance and Sexual Activity  Alcohol Use No   Review of Systems  Constitutional: Negative.   HENT: Negative.    Eyes: Negative.  Negative for blurred vision.  Respiratory: Negative.    Cardiovascular: Negative.    Gastrointestinal: Negative.   Endocrine: Negative.   Genitourinary: Negative.   Musculoskeletal: Negative.   Skin: Negative.   Allergic/Immunologic: Negative.   Neurological: Negative.   Hematological: Negative.   Psychiatric/Behavioral: Negative.       Today's Vitals   06/23/22 1102  BP: 112/70  Pulse: 81  Temp: 98.2 F (36.8 C)  Weight: 175 lb (79.4 kg)  Height: 5' 0.6" (1.539 m)  PainSc: 0-No pain   Body mass index is 33.5 kg/m.  Wt Readings from Last 3 Encounters:  06/23/22 175 lb (79.4 kg)  02/24/22 172 lb 6.4 oz (78.2 kg)  10/22/21 170 lb 12.8 oz (77.5 kg)     Objective:  Physical Exam Vitals and nursing note reviewed.  Constitutional:      Appearance: Normal appearance.  HENT:     Head: Normocephalic and atraumatic.     Right Ear: Tympanic membrane, ear canal and external ear normal.     Left Ear: Tympanic membrane, ear canal and external ear normal.     Nose:     Comments: Masked     Mouth/Throat:     Comments: Masked  Eyes:     Extraocular Movements: Extraocular movements intact.     Conjunctiva/sclera: Conjunctivae normal.     Pupils: Pupils are equal, round, and reactive to light.  Cardiovascular:     Rate and Rhythm: Normal rate and regular rhythm.     Pulses: Normal pulses.          Dorsalis pedis pulses are 2+ on the right side and 2+ on the left side.     Heart sounds: Normal heart sounds.  Pulmonary:     Effort: Pulmonary effort is normal.     Breath sounds: Normal breath sounds.  Chest:  Breasts:    Tanner Score is 5.     Right: Normal.     Left: Normal.  Abdominal:     General: Bowel sounds are normal.     Palpations: Abdomen is soft.     Comments: Obese, soft.   Genitourinary:    Comments: deferred Musculoskeletal:        General: Normal range of motion.     Cervical back: Normal range of motion and neck supple.  Feet:     Right foot:     Protective Sensation: 5 sites tested.  5 sites sensed.     Skin integrity: Skin  integrity normal.     Toenail Condition: Right toenails are normal.     Left foot:     Protective Sensation: 5 sites tested.  5 sites sensed.     Skin integrity: Skin integrity normal.     Toenail Condition: Left toenails are normal.  Skin:    General: Skin is warm and dry.  Neurological:     General: No focal deficit present.     Mental Status: She is alert and oriented to person, place, and time.  Psychiatric:        Mood and Affect: Mood normal.        Behavior: Behavior normal.      Assessment And Plan:  1. Encounter for general adult medical examination w/o abnormal findings Comments: A full exam was performed. Importance of monthly self breast exams was discussed with the patient. She is up to date with CRC/breast CA screening. PATIENT IS ADVISED TO GET 30-45 MINUTES REGULAR EXERCISE NO LESS THAN FOUR TO FIVE DAYS PER WEEK - BOTH WEIGHTBEARING EXERCISES AND AEROBIC ARE RECOMMENDED.  PATIENT IS ADVISED TO FOLLOW A HEALTHY DIET WITH AT LEAST SIX FRUITS/VEGGIES PER DAY, DECREASE INTAKE OF RED MEAT, AND TO INCREASE FISH INTAKE TO TWO DAYS PER WEEK.  MEATS/FISH SHOULD NOT BE FRIED, BAKED OR BROILED IS PREFERABLE.  IT IS ALSO IMPORTANT TO CUT BACK ON YOUR SUGAR INTAKE. PLEASE AVOID ANYTHING WITH ADDED SUGAR, CORN SYRUP OR OTHER SWEETENERS. IF YOU MUST USE A SWEETENER, YOU CAN TRY STEVIA. IT IS ALSO IMPORTANT TO AVOID ARTIFICIALLY SWEETENERS AND DIET BEVERAGES. LASTLY, I SUGGEST WEARING SPF 50 SUNSCREEN ON EXPOSED PARTS AND ESPECIALLY WHEN IN THE DIRECT SUNLIGHT FOR AN EXTENDED PERIOD OF TIME.  PLEASE AVOID FAST FOOD RESTAURANTS AND INCREASE YOUR WATER INTAKE. - CBC - CMP14+EGFR - Lipid panel - Hemoglobin A1c - TSH - T4, free - Specimen status report  2. Type 2 diabetes mellitus with stage 3a chronic kidney disease, without long-term current use of insulin (HCC) Comments: Diabetic foot exam was performed. Unfortunately, her new insurance does not fully cover Rybelsus, PA pending. Given  81m samples, she will take 2 tabs qd. I DISCUSSED WITH THE PATIENT AT LENGTH REGARDING THE GOALS OF GLYCEMIC CONTROL AND POSSIBLE LONG-TERM COMPLICATIONS.  I  ALSO STRESSED THE IMPORTANCE OF COMPLIANCE WITH HOME GLUCOSE MONITORING, DIETARY RESTRICTIONS INCLUDING AVOIDANCE OF SUGARY DRINKS/PROCESSED FOODS,  ALONG WITH REGULAR EXERCISE.  I  ALSO STRESSED THE IMPORTANCE OF ANNUAL EYE EXAMS, SELF FOOT CARE AND COMPLIANCE WITH OFFICE VISITS.  - POCT Urinalysis Dipstick (81002) - EKG 12-Lead - Urine microalbumin-creatinine with uACR  3. Hypertensive nephropathy Comments: Chronic, well controlled. EKG performed, NSR w/o acute changes. She will c/w chlorthalidone 279mand telmisartan 4040maily. She is encouraged to follow low sodium diet. She will f/lu in 4 months.   4. Immunization due - Varicella-zoster vaccine IM  5. Influenza vaccination declined  Patient was given opportunity to ask questions. Patient verbalized understanding of the plan and was able to repeat key elements of the plan. All questions were answered to their satisfaction.   I, RobMaximino GreenlandD, have reviewed all documentation for this visit. The documentation on 06/23/22 for the exam, diagnosis, procedures, and orders are all accurate and complete.   THE PATIENT IS ENCOURAGED TO PRACTICE SOCIAL DISTANCING DUE TO THE COVID-19 PANDEMIC.

## 2022-06-24 LAB — CBC
Hematocrit: 35.8 % (ref 34.0–46.6)
Hemoglobin: 11.1 g/dL (ref 11.1–15.9)
MCH: 24.3 pg — ABNORMAL LOW (ref 26.6–33.0)
MCHC: 31 g/dL — ABNORMAL LOW (ref 31.5–35.7)
MCV: 79 fL (ref 79–97)
Platelets: 270 10*3/uL (ref 150–450)
RBC: 4.56 x10E6/uL (ref 3.77–5.28)
RDW: 14.5 % (ref 11.7–15.4)
WBC: 5.1 10*3/uL (ref 3.4–10.8)

## 2022-06-24 LAB — MICROALBUMIN / CREATININE URINE RATIO
Creatinine, Urine: 23 mg/dL
Microalb/Creat Ratio: 20 mg/g creat (ref 0–29)
Microalbumin, Urine: 4.7 ug/mL

## 2022-06-24 LAB — CMP14+EGFR
ALT: 4 IU/L (ref 0–32)
AST: 9 IU/L (ref 0–40)
Albumin/Globulin Ratio: 1.1 — ABNORMAL LOW (ref 1.2–2.2)
Albumin: 3.9 g/dL (ref 3.8–4.9)
Alkaline Phosphatase: 85 IU/L (ref 44–121)
BUN/Creatinine Ratio: 15 (ref 9–23)
BUN: 21 mg/dL (ref 6–24)
Bilirubin Total: 0.4 mg/dL (ref 0.0–1.2)
CO2: 24 mmol/L (ref 20–29)
Calcium: 9.6 mg/dL (ref 8.7–10.2)
Chloride: 98 mmol/L (ref 96–106)
Creatinine, Ser: 1.39 mg/dL — ABNORMAL HIGH (ref 0.57–1.00)
Globulin, Total: 3.4 g/dL (ref 1.5–4.5)
Glucose: 88 mg/dL (ref 70–99)
Potassium: 3.8 mmol/L (ref 3.5–5.2)
Sodium: 135 mmol/L (ref 134–144)
Total Protein: 7.3 g/dL (ref 6.0–8.5)
eGFR: 45 mL/min/{1.73_m2} — ABNORMAL LOW (ref >59)

## 2022-06-24 LAB — HEMOGLOBIN A1C
Est. average glucose Bld gHb Est-mCnc: 123 mg/dL
Hgb A1c MFr Bld: 5.9 % — ABNORMAL HIGH (ref 4.8–5.6)

## 2022-06-24 LAB — LIPID PANEL
Chol/HDL Ratio: 3.2 ratio (ref 0.0–4.4)
Cholesterol, Total: 170 mg/dL (ref 100–199)
HDL: 53 mg/dL (ref >39)
LDL Chol Calc (NIH): 98 mg/dL (ref 0–99)
Triglycerides: 103 mg/dL (ref 0–149)
VLDL Cholesterol Cal: 19 mg/dL (ref 5–40)

## 2022-06-24 LAB — TSH: TSH: 0.637 u[IU]/mL (ref 0.450–4.500)

## 2022-06-26 LAB — SPECIMEN STATUS REPORT

## 2022-06-26 LAB — T4, FREE: Free T4: 1.08 ng/dL (ref 0.82–1.77)

## 2022-07-24 ENCOUNTER — Other Ambulatory Visit: Payer: Self-pay | Admitting: Internal Medicine

## 2022-07-29 ENCOUNTER — Other Ambulatory Visit: Payer: Self-pay | Admitting: Internal Medicine

## 2022-08-11 ENCOUNTER — Ambulatory Visit: Payer: Self-pay | Admitting: Internal Medicine

## 2022-08-11 ENCOUNTER — Ambulatory Visit (INDEPENDENT_AMBULATORY_CARE_PROVIDER_SITE_OTHER): Payer: Commercial Managed Care - HMO | Admitting: Internal Medicine

## 2022-08-11 ENCOUNTER — Encounter: Payer: Self-pay | Admitting: Internal Medicine

## 2022-08-11 VITALS — BP 118/70 | HR 60 | Temp 98.5°F | Ht 60.0 in | Wt 176.4 lb

## 2022-08-11 DIAGNOSIS — E1122 Type 2 diabetes mellitus with diabetic chronic kidney disease: Secondary | ICD-10-CM | POA: Diagnosis not present

## 2022-08-11 DIAGNOSIS — Z6834 Body mass index (BMI) 34.0-34.9, adult: Secondary | ICD-10-CM

## 2022-08-11 DIAGNOSIS — E7800 Pure hypercholesterolemia, unspecified: Secondary | ICD-10-CM

## 2022-08-11 DIAGNOSIS — E6609 Other obesity due to excess calories: Secondary | ICD-10-CM | POA: Diagnosis not present

## 2022-08-11 DIAGNOSIS — N1831 Type 2 diabetes mellitus with diabetic chronic kidney disease: Secondary | ICD-10-CM

## 2022-08-11 MED ORDER — SIMVASTATIN 20 MG PO TABS: ORAL_TABLET | ORAL | 2 refills | Status: DC

## 2022-08-11 NOTE — Progress Notes (Signed)
Barnet Glasgow Martin,acting as a Education administrator for Maximino Greenland, MD.,have documented all relevant documentation on the behalf of Maximino Greenland, MD,as directed by  Maximino Greenland, MD while in the presence of Maximino Greenland, MD.    Subjective:     Patient ID: Bridget Bates , female    DOB: 01-30-1966 , 56 y.o.   MRN: [id number]   Chief Complaint  Patient presents with   Hyperlipidemia    HPI  The patient is here today for a cholesterol check. The dose of simvastatin was increased to 20mg  at her last visit. She has not had any issues with the higher dose.  She reports compliance with meds. She denies headaches, chest pain and shortness of breath.   Regarding her diabetes, she is now on Rybelsus 3mg , 2 tabs po qd. Unfortunately, her insurance changed 05/28/22. It is not covered by this insurance. She will return to her employer's insurance in January and she states it is covered. She has no other concerns.   BP Readings from Last 3 Encounters: 08/11/22 : 118/70 06/23/22 : 112/70 02/24/22 : 116/78    Diabetes She presents for her follow-up diabetic visit. She has type 2 diabetes mellitus. Her disease course has been stable. There are no hypoglycemic associated symptoms. Pertinent negatives for diabetes include no blurred vision, no chest pain, no polydipsia, no polyphagia and no polyuria. There are no hypoglycemic complications. Risk factors for coronary artery disease include diabetes mellitus, dyslipidemia, hypertension, sedentary lifestyle and obesity. She never participates in exercise. Her breakfast blood glucose is taken between 8-9 am. Her breakfast blood glucose range is generally 110-130 mg/dl. An ACE inhibitor/angiotensin II receptor blocker is being taken. Eye exam is not current.  Hypertension This is a chronic problem. The current episode started more than 1 year ago. The problem has been gradually improving since onset. The problem is uncontrolled. Pertinent negatives include no  blurred vision, chest pain, palpitations or shortness of breath. The current treatment provides moderate improvement.     Past Medical History:  Diagnosis Date   Diabetes mellitus without complication ([place])    Hypertension    Obesity      Family History  Problem Relation Age of Onset   Diabetes Mother    Healthy Father      Current Outpatient Medications:    chlorthalidone (HYGROTON) 25 MG tablet, TAKE 1 TABLET BY MOUTH ON MONDAY, WEDNESDAY, FRIDAY ONLY, Disp: 90 tablet, Rfl: 1   glucose blood (ONETOUCH VERIO) test strip, Use as instructed to check blood sugars dx code- e11.65, Disp: 100 each, Rfl: 5   Lancets (ONETOUCH ULTRASOFT) lancets, Use as instructed, Disp: 100 each, Rfl: 12   metFORMIN (GLUCOPHAGE) 500 MG tablet, TAKE 1 TABLET(500 MG) BY MOUTH DAILY WITH BREAKFAST, Disp: 90 tablet, Rfl: 1   Semaglutide (RYBELSUS) 14 MG TABS, Take 1 tablet (14 mg total) by mouth daily., Disp: 90 tablet, Rfl: 2   telmisartan (MICARDIS) 40 MG tablet, TAKE 1 TABLET(40 MG) BY MOUTH DAILY, Disp: 90 tablet, Rfl: 2   cyclobenzaprine (FLEXERIL) 5 MG tablet, Take 1 tablet (5 mg total) by mouth 3 (three) times daily as needed for muscle spasms. (Patient not taking: Reported on 08/11/2022), Disp: 30 tablet, Rfl: 0   simvastatin (ZOCOR) 20 MG tablet, One tab po qd, Disp: 90 tablet, Rfl: 2   Allergies  Allergen Reactions   Amoxicillin Hives and Itching     Review of Systems  Constitutional: Negative.   HENT: Negative.    Eyes:  Negative.  Negative for blurred vision.  Respiratory: Negative.  Negative for shortness of breath.   Cardiovascular: Negative.  Negative for chest pain and palpitations.  Gastrointestinal: Negative.   Endocrine: Negative for polydipsia, polyphagia and polyuria.     Today's Vitals   08/11/22 0913  BP: 118/70  Pulse: 60  Temp: 98.5 F (36.9 C)  TempSrc: Oral  Weight: 176 lb 6.4 oz (80 kg)  Height: 5' (1.524 m)  PainSc: 0-No pain   Body mass index is 34.45 kg/m.   Wt Readings from Last 3 Encounters:  08/11/22 176 lb 6.4 oz (80 kg)  06/23/22 175 lb (79.4 kg)  02/24/22 172 lb 6.4 oz (78.2 kg)    Objective:  Physical Exam Vitals and nursing note reviewed.  Constitutional:      Appearance: Normal appearance.  HENT:     Head: Normocephalic and atraumatic.     Nose:     Comments: Masked     Mouth/Throat:     Comments: Masked  Eyes:     Extraocular Movements: Extraocular movements intact.  Cardiovascular:     Rate and Rhythm: Normal rate and regular rhythm.     Heart sounds: Normal heart sounds.  Pulmonary:     Effort: Pulmonary effort is normal.     Breath sounds: Normal breath sounds.  Skin:    General: Skin is warm.  Neurological:     General: No focal deficit present.     Mental Status: She is alert.  Psychiatric:        Mood and Affect: Mood normal.        Behavior: Behavior normal.       Assessment And Plan:     1. Pure hypercholesterolemia Comments: I will check fasting lipid panel and ALT today. She will c/w simvastatin 20mg  daily for now. - simvastatin (ZOCOR) 20 MG tablet; One tab po qd  Dispense: 90 tablet; Refill: 2 - Lipid panel - ALT  2. Type 2 diabetes mellitus with stage 3a chronic kidney disease, without long-term current use of insulin (HCC) Comments: Chronic, currently on Rybelsus 6mg  qd. She will c/w this until Jan 2024, will redo PA in Jan. She was given samples today.  3. Class 1 obesity due to excess calories with serious comorbidity and body mass index (BMI) of 34.0 to 34.9 in adult Comments: She is encouraged to aim for at least 150 minutes of exercise per week, while striving for BMI<30 to decrease cardiac risk.    Patient was given opportunity to ask questions. Patient verbalized understanding of the plan and was able to repeat key elements of the plan. All questions were answered to their satisfaction.   I, 02-20-1987, MD, have reviewed all documentation for this visit. The documentation on  08/11/22 for the exam, diagnosis, procedures, and orders are all accurate and complete.   IF YOU HAVE BEEN REFERRED TO A SPECIALIST, IT MAY TAKE 1-2 WEEKS TO SCHEDULE/PROCESS THE REFERRAL. IF YOU HAVE NOT HEARD FROM US/SPECIALIST IN TWO WEEKS, PLEASE GIVE Gwynneth Aliment A CALL AT [phone].   THE PATIENT IS ENCOURAGED TO PRACTICE SOCIAL DISTANCING DUE TO THE COVID-19 PANDEMIC.

## 2022-08-11 NOTE — Patient Instructions (Signed)

## 2022-08-12 LAB — LIPID PANEL
Chol/HDL Ratio: 2.8 ratio (ref 0.0–4.4)
Cholesterol, Total: 150 mg/dL (ref 100–199)
HDL: 53 mg/dL (ref >39)
LDL Chol Calc (NIH): 83 mg/dL (ref 0–99)
Triglycerides: 74 mg/dL (ref 0–149)
VLDL Cholesterol Cal: 14 mg/dL (ref 5–40)

## 2022-08-12 LAB — ALT: ALT: 3 IU/L (ref 0–32)

## 2022-10-14 ENCOUNTER — Encounter: Payer: BLUE CROSS/BLUE SHIELD | Admitting: Internal Medicine

## 2022-10-14 NOTE — Progress Notes (Signed)
Appt cancelled, we are out of network with her insurance. Pt chose to cancel appt.

## 2022-10-14 NOTE — Patient Instructions (Signed)

## 2022-10-15 ENCOUNTER — Other Ambulatory Visit: Payer: Self-pay | Admitting: Internal Medicine

## 2022-11-18 ENCOUNTER — Ambulatory Visit: Payer: Commercial Managed Care - HMO | Admitting: Internal Medicine

## 2022-11-18 ENCOUNTER — Encounter: Payer: Self-pay | Admitting: Internal Medicine

## 2022-11-18 VITALS — BP 108/64 | HR 68 | Temp 97.6°F | Ht 60.0 in | Wt 191.4 lb

## 2022-11-18 DIAGNOSIS — I129 Hypertensive chronic kidney disease with stage 1 through stage 4 chronic kidney disease, or unspecified chronic kidney disease: Secondary | ICD-10-CM

## 2022-11-18 DIAGNOSIS — Z6837 Body mass index (BMI) 37.0-37.9, adult: Secondary | ICD-10-CM

## 2022-11-18 DIAGNOSIS — E1122 Type 2 diabetes mellitus with diabetic chronic kidney disease: Secondary | ICD-10-CM | POA: Diagnosis not present

## 2022-11-18 DIAGNOSIS — N1831 Chronic kidney disease, stage 3a: Secondary | ICD-10-CM

## 2022-11-18 DIAGNOSIS — Z23 Encounter for immunization: Secondary | ICD-10-CM | POA: Diagnosis not present

## 2022-11-18 LAB — CMP14+EGFR
ALT: 9 IU/L (ref 0–32)
AST: 14 IU/L (ref 0–40)
Albumin/Globulin Ratio: 1.3 (ref 1.2–2.2)
Albumin: 4.2 g/dL (ref 3.8–4.9)
Alkaline Phosphatase: 105 IU/L (ref 44–121)
BUN/Creatinine Ratio: 20 (ref 9–23)
BUN: 28 mg/dL — ABNORMAL HIGH (ref 6–24)
Bilirubin Total: 0.4 mg/dL (ref 0.0–1.2)
CO2: 19 mmol/L — ABNORMAL LOW (ref 20–29)
Calcium: 9.4 mg/dL (ref 8.7–10.2)
Chloride: 105 mmol/L (ref 96–106)
Creatinine, Ser: 1.4 mg/dL — ABNORMAL HIGH (ref 0.57–1.00)
Globulin, Total: 3.2 g/dL (ref 1.5–4.5)
Glucose: 98 mg/dL (ref 70–99)
Potassium: 4.9 mmol/L (ref 3.5–5.2)
Sodium: 139 mmol/L (ref 134–144)
Total Protein: 7.4 g/dL (ref 6.0–8.5)
eGFR: 44 mL/min/{1.73_m2} — ABNORMAL LOW (ref >59)

## 2022-11-18 LAB — CBC
Hematocrit: 35.9 % (ref 34.0–46.6)
Hemoglobin: 11.4 g/dL (ref 11.1–15.9)
MCH: 24.1 pg — ABNORMAL LOW (ref 26.6–33.0)
MCHC: 31.8 g/dL (ref 31.5–35.7)
MCV: 76 fL — ABNORMAL LOW (ref 79–97)
Platelets: 235 10*3/uL (ref 150–450)
RBC: 4.73 x10E6/uL (ref 3.77–5.28)
RDW: 14.5 % (ref 11.7–15.4)
WBC: 3.6 10*3/uL (ref 3.4–10.8)

## 2022-11-18 LAB — HEMOGLOBIN A1C
Est. average glucose Bld gHb Est-mCnc: 131 mg/dL
Hgb A1c MFr Bld: 6.2 % — ABNORMAL HIGH (ref 4.8–5.6)

## 2022-11-18 MED ORDER — RYBELSUS 7 MG PO TABS: ORAL_TABLET | ORAL | 0 refills | Status: DC

## 2022-11-18 NOTE — Patient Instructions (Signed)

## 2022-11-18 NOTE — Progress Notes (Signed)
I,Bridget Bates,acting as a scribe for Bridget Greenland, MD.,have documented all relevant documentation on the behalf of Bridget Greenland, MD,as directed by  Bridget Greenland, MD while in the presence of Bridget Greenland, MD.    Subjective:     Patient ID: Bridget Bates , female    DOB: [birth year] , 57 y.o.   MRN: [id number]   Chief Complaint  Patient presents with   Diabetes   Hypertension    HPI  The patient is here today for a follow-up on her diabetes and blood pressure.  She reports compliance with meds. She denies headaches, chest pain and shortness of breath. She admits she has been eating more. Has been out of Rybelsus rx since her insurance changed Sept 2023.  She has been taking 75m Rybelsus samples.  She has no specific questions or concerns at this time.   Diabetes She presents for her follow-up diabetic visit. She has type 2 diabetes mellitus. Her disease course has been stable. There are no hypoglycemic associated symptoms. Pertinent negatives for diabetes include no blurred vision. There are no hypoglycemic complications. Risk factors for coronary artery disease include diabetes mellitus, dyslipidemia, hypertension, sedentary lifestyle and obesity. She never participates in exercise. Her breakfast blood glucose is taken between 8-9 am. Her breakfast blood glucose range is generally 110-130 mg/dl. An ACE inhibitor/angiotensin II receptor blocker is being taken. Eye exam is not current.  Hypertension This is a chronic problem. The current episode started more than 1 year ago. The problem has been gradually improving since onset. The problem is uncontrolled. Pertinent negatives include no blurred vision or palpitations. The current treatment provides moderate improvement.     Past Medical History:  Diagnosis Date   [place] [place]    Hypertension    Obesity      Family History  Problem Relation Age of Onset   Diabetes Mother    Healthy  Father      Current Outpatient Medications:    chlorthalidone (HYGROTON) 25 MG tablet, TAKE 1 TABLET BY MOUTH ON MONDAY, WEDNESDAY, FRIDAY ONLY, Disp: 90 tablet, Rfl: 1   glucose blood (ONETOUCH VERIO) test strip, Use as instructed to check blood sugars dx code- e11.65, Disp: 100 each, Rfl: 5   Lancets (ONETOUCH ULTRASOFT) lancets, Use as instructed, Disp: 100 each, Rfl: 12   metFORMIN (GLUCOPHAGE) 500 MG tablet, TAKE 1 TABLET(500 MG) BY MOUTH DAILY WITH BREAKFAST, Disp: 90 tablet, Rfl: 1   Semaglutide (RYBELSUS) 7 MG TABS, One tab po qd, Disp: 30 tablet, Rfl: 0   simvastatin (ZOCOR) 20 MG tablet, One tab po qd, Disp: 90 tablet, Rfl: 2   telmisartan (MICARDIS) 40 MG tablet, TAKE 1 TABLET(40 MG) BY MOUTH DAILY, Disp: 90 tablet, Rfl: 2   cyclobenzaprine (FLEXERIL) 5 MG tablet, Take 1 tablet (5 mg total) by mouth 3 (three) times daily as needed for muscle spasms. (Patient not taking: Reported on 08/11/2022), Disp: 30 tablet, Rfl: 0   Allergies  Allergen Reactions   Amoxicillin Hives and Itching     Review of Systems  Constitutional: Negative.   Eyes:  Negative for blurred vision.  Respiratory: Negative.    Cardiovascular: Negative.  Negative for palpitations.  Neurological: Negative.   Psychiatric/Behavioral: Negative.       Today's Vitals   11/18/22 1055  BP: 108/64  Pulse: 68  Temp: 97.6 F (36.4 C)  SpO2: 98%  Weight: 191 lb 6.4 oz (86.8 kg)  Height: 5' (1.524 m)  Body mass index is 37.38 kg/m.  Wt Readings from Last 3 Encounters:  11/18/22 191 lb 6.4 oz (86.8 kg)  08/11/22 176 lb 6.4 oz (80 kg)  06/23/22 175 lb (79.4 kg)    Objective:  Physical Exam Vitals and nursing note reviewed.  Constitutional:      Appearance: Normal appearance. She is obese.  HENT:     Head: Normocephalic and atraumatic.     Nose:     Comments: Masked     Mouth/Throat:     Comments: Masked  Eyes:     Extraocular Movements: Extraocular movements intact.  Cardiovascular:     Rate and  Rhythm: Normal rate and regular rhythm.     Heart sounds: Normal heart sounds.  Pulmonary:     Effort: Pulmonary effort is normal.     Breath sounds: Normal breath sounds.  Musculoskeletal:     Cervical back: Normal range of motion.  Skin:    General: Skin is warm.  Neurological:     General: No focal deficit present.     Mental Status: She is alert.  Psychiatric:        Mood and Affect: Mood normal.        Behavior: Behavior normal.         Assessment And Plan:     1. Type 2 diabetes mellitus with stage 3a chronic kidney disease, without long-term current use of insulin (HCC) Comments: Chronic, she will c/w Rybelsus 46m for now. I will send rx 724mto pharmacy to start PA process. Encouraged to restrict refined carbs.  She will f/u in 3-4 months for re-evaluation.  - CMP14+EGFR - CBC - Hemoglobin A1c  2. Hypertensive nephropathy Comments: Chronic, well controlled. She will c/w chlorthalidone 257mnd telmisartan 61m84mily. She is encouraged to follow low sodium diet. - CMP14+EGFR - CBC - Hemoglobin A1c  3. Class 2 severe obesity due to excess calories with serious comorbidity and body mass index (BMI) of 37.0 to 37.9 in adult ([facility]: She is encouraged to strive for BMI less than 30 to decrease cardiac risk. Advised to aim for at least 150 minutes of exercise per week.  4. Need for shingles vaccine - Zoster Recombinant (Shingrix )  Patient was given opportunity to ask questions. Patient verbalized understanding of the plan and was able to repeat key elements of the plan. All questions were answered to their satisfaction.   I, RobyMaximino Bates, have reviewed all documentation for this visit. The documentation on 11/18/22 for the exam, diagnosis, procedures, and orders are all accurate and complete.   IF YOU HAVE BEEN REFERRED TO A SPECIALIST, IT MAY TAKE 1-2 WEEKS TO SCHEDULE/PROCESS THE REFERRAL. IF YOU HAVE NOT HEARD FROM US/SPECIALIST IN TWO WEEKS, PLEASE GIVE  US [place] AT [phone].   THE PATIENT IS ENCOURAGED TO PRACTICE SOCIAL DISTANCING DUE TO THE COVID-19 PANDEMIC.

## 2022-11-27 ENCOUNTER — Other Ambulatory Visit: Payer: Self-pay | Admitting: Internal Medicine

## 2022-12-20 ENCOUNTER — Other Ambulatory Visit: Payer: Self-pay

## 2022-12-20 MED ORDER — RYBELSUS 7 MG PO TABS: ORAL_TABLET | ORAL | 0 refills | Status: DC

## 2023-01-07 ENCOUNTER — Other Ambulatory Visit: Payer: Self-pay

## 2023-01-07 ENCOUNTER — Telehealth: Payer: Self-pay

## 2023-01-07 NOTE — Telephone Encounter (Signed)
PA for Bydureon processed. The medication is covered. PA not required. Cover my meds states: If clinically appropriate, you may change the prescription. A therapeutic alternative may be available. Questions on alternatives? Contact CoverMyMeds Support [facility] at [phone]. You can also review [employer]'s formulary online or call them directly. Compare the original medication with possible alternatives: Ozempic & Victoza. Both need PA.

## 2023-01-15 LAB — HM DIABETES EYE EXAM

## 2023-02-07 ENCOUNTER — Encounter: Payer: Self-pay | Admitting: Internal Medicine

## 2023-02-15 LAB — LAB REPORT - SCANNED
Albumin, Urine POC: 4
Creatinine, POC: 1.27 mg/dL
Microalb Creat Ratio: 34

## 2023-02-20 ENCOUNTER — Other Ambulatory Visit: Payer: Self-pay | Admitting: Internal Medicine

## 2023-02-22 ENCOUNTER — Ambulatory Visit: Payer: Commercial Managed Care - HMO | Admitting: Internal Medicine

## 2023-03-14 ENCOUNTER — Ambulatory Visit: Payer: Commercial Managed Care - HMO | Admitting: Internal Medicine

## 2023-03-14 ENCOUNTER — Other Ambulatory Visit: Payer: Self-pay

## 2023-03-14 ENCOUNTER — Encounter: Payer: Self-pay | Admitting: Internal Medicine

## 2023-03-14 VITALS — BP 120/78 | HR 85 | Temp 98.0°F | Ht 60.0 in | Wt 207.8 lb

## 2023-03-14 DIAGNOSIS — E1122 Type 2 diabetes mellitus with diabetic chronic kidney disease: Secondary | ICD-10-CM

## 2023-03-14 DIAGNOSIS — N1831 Chronic kidney disease, stage 3a: Secondary | ICD-10-CM

## 2023-03-14 DIAGNOSIS — Z6841 Body Mass Index (BMI) 40.0 and over, adult: Secondary | ICD-10-CM

## 2023-03-14 DIAGNOSIS — E7800 Pure hypercholesterolemia, unspecified: Secondary | ICD-10-CM | POA: Diagnosis not present

## 2023-03-14 DIAGNOSIS — I129 Hypertensive chronic kidney disease with stage 1 through stage 4 chronic kidney disease, or unspecified chronic kidney disease: Secondary | ICD-10-CM | POA: Diagnosis not present

## 2023-03-14 MED ORDER — TRULICITY 0.75 MG/0.5ML ~~LOC~~ SOAJ: 0.7500 mg | SUBCUTANEOUS | 3 refills | Status: DC

## 2023-03-14 MED ORDER — ONETOUCH VERIO W/DEVICE KIT
PACK | 0 refills | Status: DC
Start: 2023-03-14 — End: 2023-12-16

## 2023-03-14 NOTE — Patient Instructions (Signed)

## 2023-03-14 NOTE — Progress Notes (Signed)
Pura Spice as a Neurosurgeon for Gwynneth Aliment, MD.,have documented all relevant documentation on the behalf of Gwynneth Aliment, MD,as directed by  Gwynneth Aliment, MD while in the presence of Gwynneth Aliment, MD.  Subjective:  Patient ID: Bridget Bates , female    DOB: [birth year] , 57 y.o.   MRN: [id number]  Chief Complaint  Patient presents with   Diabetes    HPI  The patient is here today for a follow-up on her diabetes and blood pressure.  She reports compliance with meds. She denies headaches, chest pain and shortness of breath. She is currently out of Rybelus because her insurance doesn't cover it. She admits she did not notify the office that she was out of medication. She admits she hasn't been checking her sugars.  She states she needs a new meter, she hasn't been able to calibrate the current one.   Diabetes She presents for her follow-up diabetic visit. She has type 2 diabetes mellitus. Her disease course has been stable. There are no hypoglycemic associated symptoms. Pertinent negatives for diabetes include no blurred vision. There are no hypoglycemic complications. Risk factors for coronary artery disease include diabetes mellitus, dyslipidemia, hypertension, sedentary lifestyle and obesity. She never participates in exercise. Her breakfast blood glucose is taken between 8-9 am. Her breakfast blood glucose range is generally 110-130 mg/dl. An ACE inhibitor/angiotensin II receptor blocker is being taken. Eye exam is not current.  Hypertension This is a chronic problem. The current episode started more than 1 year ago. The problem has been gradually improving since onset. The problem is uncontrolled. Pertinent negatives include no blurred vision or palpitations. The current treatment provides moderate improvement.     Past Medical History:  Diagnosis Date   Diabetes mellitus without complication (HCC)    Hypertension    Obesity      Family History  Problem Relation Age  of Onset   Diabetes Mother    Healthy Father      Current Outpatient Medications:    chlorthalidone (HYGROTON) 25 MG tablet, TAKE 1 TABLET BY MOUTH ON MONDAY, WEDNESDAY, FRIDAY ONLY, Disp: 90 tablet, Rfl: 1   Dulaglutide (TRULICITY) 0.75 MG/0.5ML SOPN, Inject 0.75 mg into the skin once a week., Disp: 2 mL, Rfl: 3   glucose blood (ONETOUCH VERIO) test strip, Use as instructed to check blood sugars dx code- e11.65, Disp: 100 each, Rfl: 5   Lancets (ONETOUCH ULTRASOFT) lancets, Use as instructed, Disp: 100 each, Rfl: 12   metFORMIN (GLUCOPHAGE) 500 MG tablet, TAKE 1 TABLET(500 MG) BY MOUTH DAILY WITH BREAKFAST, Disp: 90 tablet, Rfl: 1   simvastatin (ZOCOR) 20 MG tablet, One tab po qd, Disp: 90 tablet, Rfl: 2   telmisartan (MICARDIS) 40 MG tablet, TAKE 1 TABLET(40 MG) BY MOUTH DAILY, Disp: 90 tablet, Rfl: 2   Vitamin D, Cholecalciferol, 25 MCG (1000 UT) CAPS, Take 1 capsule by mouth daily at 12 noon., Disp: , Rfl:    Allergies  Allergen Reactions   Amoxicillin Hives and Itching     Review of Systems  Constitutional: Negative.   Eyes:  Negative for blurred vision.  Respiratory: Negative.    Cardiovascular: Negative.  Negative for palpitations.  Gastrointestinal: Negative.   Musculoskeletal: Negative.   Neurological: Negative.   Psychiatric/Behavioral: Negative.       Today's Vitals   03/14/23 1100  BP: 120/78  Pulse: 85  Temp: 98 F (36.7 C)  Weight: 207 lb 12.8 oz (94.3 kg)  Height: 5' (1.524 m)  PainSc: 0-No pain   Body mass index is 40.58 kg/m.  Wt Readings from Last 3 Encounters:  03/14/23 207 lb 12.8 oz (94.3 kg)  11/18/22 191 lb 6.4 oz (86.8 kg)  08/11/22 176 lb 6.4 oz (80 kg)    The 10-year ASCVD risk score (Arnett DK, et al., 2019) is: 4.9%   Values used to calculate the score:     Age: 67 years     Sex: Female     Is Non-Hispanic African American: Yes     Diabetic: Yes     Tobacco smoker: No     Systolic Blood Pressure: 120 mmHg     Is BP treated: No      HDL Cholesterol: 53 mg/dL     Total Cholesterol: 150 mg/dL  Objective:  Physical Exam Vitals and nursing note reviewed.  Constitutional:      Appearance: Normal appearance.  HENT:     Head: Normocephalic and atraumatic.  Eyes:     Extraocular Movements: Extraocular movements intact.  Cardiovascular:     Rate and Rhythm: Normal rate and regular rhythm.     Heart sounds: Normal heart sounds.  Pulmonary:     Effort: Pulmonary effort is normal.     Breath sounds: Normal breath sounds.  Musculoskeletal:     Cervical back: Normal range of motion.  Skin:    General: Skin is warm.  Neurological:     General: No focal deficit present.     Mental Status: She is alert.  Psychiatric:        Mood and Affect: Mood normal.        Behavior: Behavior normal.         Assessment And Plan:  1. Type 2 diabetes mellitus with stage 3a chronic kidney disease, without long-term current use of insulin (HCC) Comments: Chronic, she was given samples of Rybelsus 3mg  to take until Trulicity is approved. I will check an a1c. [facility] labs reviewed. F/u 3 months. - Hemoglobin A1c  2. Hypertensive nephropathy Comments: Chronic, controlled. She will c/w telmisartan 40mg  and chlorthalidone 25mg  daily. She is encouraged to follow a low sodium diet.  3. Pure hypercholesterolemia Comments: Chronic, LDL Goal < 70. She will c/w simvastatin 20mg  daily.  4. Class 3 severe obesity due to excess calories with serious comorbidity and body mass index (BMI) of 40.0 to 44.9 in adult [facility]) Comments: She is aware of 16lb weight gain since February 2024. She is encouraged to incorporate more exercise into her daily routine.    Return for keep next appt as scheduled .  Patient was given opportunity to ask questions. Patient verbalized understanding of the plan and was able to repeat key elements of the plan. All questions were answered to their satisfaction.   I, Gwynneth Aliment, MD, have reviewed all  documentation for this visit. The documentation on 03/14/23 for the exam, diagnosis, procedures, and orders are all accurate and complete.  IF YOU HAVE BEEN REFERRED TO A SPECIALIST, IT MAY TAKE 1-2 WEEKS TO SCHEDULE/PROCESS THE REFERRAL. IF YOU HAVE NOT HEARD FROM US/SPECIALIST IN TWO WEEKS, PLEASE GIVE [place] A CALL AT [phone].

## 2023-03-15 LAB — HEMOGLOBIN A1C
Est. average glucose Bld gHb Est-mCnc: 143 mg/dL
Hgb A1c MFr Bld: 6.6 % — ABNORMAL HIGH (ref 4.8–5.6)

## 2023-05-09 ENCOUNTER — Telehealth: Payer: Self-pay

## 2023-05-09 NOTE — Telephone Encounter (Signed)
err

## 2023-06-16 ENCOUNTER — Other Ambulatory Visit: Payer: Self-pay | Admitting: Internal Medicine

## 2023-07-05 ENCOUNTER — Ambulatory Visit (INDEPENDENT_AMBULATORY_CARE_PROVIDER_SITE_OTHER): Payer: Commercial Managed Care - HMO | Admitting: Internal Medicine

## 2023-07-05 ENCOUNTER — Encounter: Payer: Self-pay | Admitting: Internal Medicine

## 2023-07-05 VITALS — BP 118/78 | HR 85 | Temp 98.0°F | Ht 60.0 in | Wt 203.8 lb

## 2023-07-05 DIAGNOSIS — N1831 Chronic kidney disease, stage 3a: Secondary | ICD-10-CM | POA: Diagnosis not present

## 2023-07-05 DIAGNOSIS — Z0000 Encounter for general adult medical examination without abnormal findings: Secondary | ICD-10-CM | POA: Diagnosis not present

## 2023-07-05 DIAGNOSIS — E1122 Type 2 diabetes mellitus with diabetic chronic kidney disease: Secondary | ICD-10-CM | POA: Diagnosis not present

## 2023-07-05 DIAGNOSIS — E7800 Pure hypercholesterolemia, unspecified: Secondary | ICD-10-CM

## 2023-07-05 DIAGNOSIS — I129 Hypertensive chronic kidney disease with stage 1 through stage 4 chronic kidney disease, or unspecified chronic kidney disease: Secondary | ICD-10-CM | POA: Diagnosis not present

## 2023-07-05 DIAGNOSIS — Z6839 Body mass index (BMI) 39.0-39.9, adult: Secondary | ICD-10-CM

## 2023-07-05 DIAGNOSIS — E66812 Obesity, class 2: Secondary | ICD-10-CM

## 2023-07-05 LAB — POCT URINALYSIS DIPSTICK
Bilirubin, UA: NEGATIVE
Glucose, UA: NEGATIVE
Ketones, UA: NEGATIVE
Nitrite, UA: NEGATIVE
Protein, UA: NEGATIVE
Spec Grav, UA: 1.02 (ref 1.010–1.025)
Urobilinogen, UA: 0.2 U/dL
pH, UA: 5.5 (ref 5.0–8.0)

## 2023-07-05 NOTE — Progress Notes (Signed)
I,Victoria T Deloria Lair, CMA,acting as a Neurosurgeon for Gwynneth Aliment, MD.,have documented all relevant documentation on the behalf of Gwynneth Aliment, MD,as directed by  Gwynneth Aliment, MD while in the presence of Gwynneth Aliment, MD.  Subjective:    Patient ID: Bridget Bates , female    DOB: 1966-02-16 , 57 y.o.   MRN: [id number]  Chief Complaint  Patient presents with   Annual Exam   Diabetes   Hypertension   Hyperlipidemia    HPI  She is here today for a full physical examination. Patient does not have a GYN provider. She reports compliance with meds. She denies headaches, chest pain and shortness of breath. She denies having any specific questions or concerns.   Diabetes She presents for her follow-up diabetic visit. She has type 2 diabetes mellitus. Her disease course has been stable. There are no hypoglycemic associated symptoms. Pertinent negatives for diabetes include no blurred vision. There are no hypoglycemic complications. Risk factors for coronary artery disease include diabetes mellitus, dyslipidemia, hypertension, sedentary lifestyle and obesity. She never participates in exercise. Her breakfast blood glucose is taken between 8-9 am. Her breakfast blood glucose range is generally 110-130 mg/dl. An ACE inhibitor/angiotensin II receptor blocker is being taken. Eye exam is not current.  Hypertension This is a chronic problem. The current episode started more than 1 year ago. The problem has been gradually improving since onset. The problem is uncontrolled. Pertinent negatives include no blurred vision.     Past Medical History:  Diagnosis Date   Diabetes mellitus without complication (HCC)    Hypertension    Obesity      Family History  Problem Relation Age of Onset   Diabetes Mother    Healthy Father      Current Outpatient Medications:    Blood Glucose Monitoring Suppl (ONETOUCH VERIO) w/Device KIT, USE ONCE DAILY TO CHECK BLOOD SUGARS., Disp: 1 kit, Rfl: 0    chlorthalidone (HYGROTON) 25 MG tablet, TAKE 1 TABLET BY MOUTH ON MONDAY, WEDNESDAY, FRIDAY ONLY, Disp: 90 tablet, Rfl: 2   Dulaglutide (TRULICITY) 0.75 MG/0.5ML SOPN, Inject 0.75 mg into the skin once a week., Disp: 2 mL, Rfl: 3   glucose blood (ONETOUCH VERIO) test strip, Use as instructed to check blood sugars dx code- e11.65, Disp: 100 each, Rfl: 5   Lancets (ONETOUCH ULTRASOFT) lancets, Use as instructed, Disp: 100 each, Rfl: 12   metFORMIN (GLUCOPHAGE) 500 MG tablet, TAKE 1 TABLET(500 MG) BY MOUTH DAILY WITH BREAKFAST, Disp: 90 tablet, Rfl: 1   simvastatin (ZOCOR) 20 MG tablet, One tab po qd, Disp: 90 tablet, Rfl: 2   telmisartan (MICARDIS) 40 MG tablet, TAKE 1 TABLET(40 MG) BY MOUTH DAILY, Disp: 90 tablet, Rfl: 2   Vitamin D, Cholecalciferol, 25 MCG (1000 UT) CAPS, Take 1 capsule by mouth daily at 12 noon., Disp: , Rfl:    Allergies  Allergen Reactions   Amoxicillin Hives and Itching      The patient states she uses tubal ligation for birth control. No LMP recorded. (Menstrual status: Perimenopausal).. Negative for Dysmenorrhea. Negative for: breast discharge, breast lump(s), breast pain and breast self exam. Associated symptoms include abnormal vaginal bleeding. Pertinent negatives include abnormal bleeding (hematology), anxiety, decreased libido, depression, difficulty falling sleep, dyspareunia, history of infertility, nocturia, sexual dysfunction, sleep disturbances, urinary incontinence, urinary urgency, vaginal discharge and vaginal itching. Diet regular.The patient states her exercise level is  moderate -walking.   . The patient's tobacco use is:  Social History  Tobacco Use  Smoking Status Never  Smokeless Tobacco Never  . She has been exposed to passive smoke. The patient's alcohol use is:  Social History   Substance and Sexual Activity  Alcohol Use No      Review of Systems  Constitutional: Negative.   HENT: Negative.    Eyes: Negative.  Negative for blurred vision.   Respiratory: Negative.    Cardiovascular: Negative.   Gastrointestinal: Negative.   Endocrine: Negative.   Genitourinary: Negative.   Musculoskeletal: Negative.   Skin: Negative.   Allergic/Immunologic: Negative.   Neurological: Negative.   Hematological: Negative.   Psychiatric/Behavioral: Negative.       Today's Vitals   07/05/23 1102  BP: 118/78  Pulse: 85  Temp: 98 F (36.7 C)  SpO2: 98%  Weight: 203 lb 12.8 oz (92.4 kg)  Height: 5' (1.524 m)   Body mass index is 39.8 kg/m.  Wt Readings from Last 3 Encounters:  07/05/23 203 lb 12.8 oz (92.4 kg)  03/14/23 207 lb 12.8 oz (94.3 kg)  11/18/22 191 lb 6.4 oz (86.8 kg)     Objective:  Physical Exam Vitals and nursing note reviewed.  Constitutional:      Appearance: Normal appearance. She is obese.  HENT:     Head: Normocephalic and atraumatic.     Right Ear: Tympanic membrane, ear canal and external ear normal.     Left Ear: Tympanic membrane, ear canal and external ear normal.     Nose: Nose normal.     Mouth/Throat:     Mouth: Mucous membranes are moist.     Pharynx: Oropharynx is clear.  Eyes:     Extraocular Movements: Extraocular movements intact.     Conjunctiva/sclera: Conjunctivae normal.     Pupils: Pupils are equal, round, and reactive to light.  Cardiovascular:     Rate and Rhythm: Normal rate and regular rhythm.     Pulses: Normal pulses.          Dorsalis pedis pulses are 2+ on the right side and 2+ on the left side.     Heart sounds: Normal heart sounds.  Pulmonary:     Effort: Pulmonary effort is normal.     Breath sounds: Normal breath sounds.  Chest:  Breasts:    Tanner Score is 5.     Right: Normal.     Left: Normal.  Abdominal:     General: Bowel sounds are normal.     Palpations: Abdomen is soft.     Comments: Rounded, soft. Obese.   Genitourinary:    Comments: deferred Musculoskeletal:        General: No deformity. Normal range of motion.     Cervical back: Normal range of  motion and neck supple.  Feet:     Right foot:     Protective Sensation: 5 sites tested.  5 sites sensed.     Skin integrity: Dry skin present.     Toenail Condition: Right toenails are normal.     Left foot:     Protective Sensation: 5 sites tested.  5 sites sensed.     Skin integrity: Dry skin present.     Toenail Condition: Left toenails are normal.  Skin:    General: Skin is warm and dry.  Neurological:     General: No focal deficit present.     Mental Status: She is alert and oriented to person, place, and time.  Psychiatric:        Mood and Affect: Mood  normal.        Behavior: Behavior normal.         Assessment And Plan:     Routine general medical examination at health care facility Assessment & Plan: A full exam was performed.  Importance of monthly self breast exams was discussed with the patient.  She is advised to get 30-45 minutes of regular exercise, no less than four to five days per week. Both weight-bearing and aerobic exercises are recommended.  She is advised to follow a healthy diet with at least six fruits/veggies per day, decrease intake of red meat and other saturated fats and to increase fish intake to twice weekly.  Meats/fish should not be fried -- baked, boiled or broiled is preferable. It is also important to cut back on your sugar intake.  Be sure to read labels - try to avoid anything with added sugar, high fructose corn syrup or other sweeteners.  If you must use a sweetener, you can try stevia or monkfruit.  It is also important to avoid artificially sweetened foods/beverages and diet drinks. Lastly, wear SPF 50 sunscreen on exposed skin and when in direct sunlight for an extended period of time.  Be sure to avoid fast food restaurants and aim for at least 60 ounces of water daily.      Orders: -     CBC -     CMP14+EGFR -     Lipid panel -     Hemoglobin A1c -     TSH  Type 2 diabetes mellitus with stage 3a chronic kidney disease, without  long-term current use of insulin (HCC) Assessment & Plan: Chronic, diabetic foot exam was performed.  She will continue with Rybelsus 3mg  daily. She will rto in four months for re-evaluation. I DISCUSSED WITH THE PATIENT AT LENGTH REGARDING THE GOALS OF GLYCEMIC CONTROL AND POSSIBLE LONG-TERM COMPLICATIONS.  I  ALSO STRESSED THE IMPORTANCE OF COMPLIANCE WITH HOME GLUCOSE MONITORING, DIETARY RESTRICTIONS INCLUDING AVOIDANCE OF SUGARY DRINKS/PROCESSED FOODS,  ALONG WITH REGULAR EXERCISE.  I  ALSO STRESSED THE IMPORTANCE OF ANNUAL EYE EXAMS, SELF FOOT CARE AND COMPLIANCE WITH OFFICE VISITS.   Orders: -     POCT urinalysis dipstick -     Microalbumin / creatinine urine ratio -     EKG 12-Lead  Hypertensive nephropathy Assessment & Plan: Chronic, well controlled.  EKG performed, NSR w/o acute changes. She will continue with telmisartan 40mg  daily and chlorthaddone 25mg  on MWF only.  Encouraged to follow low sodium diet. No med changes.   Orders: -     POCT urinalysis dipstick -     Microalbumin / creatinine urine ratio -     EKG 12-Lead  Pure hypercholesterolemia Assessment & Plan: Chronic, LDL goal is less than 70.  She will continue with simvastatin daily. Encouraged to follow heart healthy lifestyle.    Class 2 severe obesity due to excess calories with serious comorbidity and body mass index (BMI) of 39.0 to 39.9 in adult [facility]) Assessment & Plan: She is encouraged to strive for BMI less than 30 to decrease cardiac risk. Advised to aim for at least 150 minutes of exercise per week.    Other orders -     Iron and TIBC -     T4, free -     Ferritin -     Specimen status report  She is encouraged to strive for BMI less than 30 to decrease cardiac risk. Advised to aim for at least 150 minutes of  exercise per week.    Return for 1 year HM, 4 month bp & dm f/u.Marland Kitchen Patient was given opportunity to ask questions. Patient verbalized understanding of the plan and was able to repeat key  elements of the plan. All questions were answered to their satisfaction.   I, Gwynneth Aliment, MD, have reviewed all documentation for this visit. The documentation on 07/05/23 for the exam, diagnosis, procedures, and orders are all accurate and complete.

## 2023-07-05 NOTE — Assessment & Plan Note (Signed)
Chronic, diabetic foot exam was performed.  She will continue with Rybelsus 3mg  daily. She will rto in four months for re-evaluation. I DISCUSSED WITH THE PATIENT AT LENGTH REGARDING THE GOALS OF GLYCEMIC CONTROL AND POSSIBLE LONG-TERM COMPLICATIONS.  I  ALSO STRESSED THE IMPORTANCE OF COMPLIANCE WITH HOME GLUCOSE MONITORING, DIETARY RESTRICTIONS INCLUDING AVOIDANCE OF SUGARY DRINKS/PROCESSED FOODS,  ALONG WITH REGULAR EXERCISE.  I  ALSO STRESSED THE IMPORTANCE OF ANNUAL EYE EXAMS, SELF FOOT CARE AND COMPLIANCE WITH OFFICE VISITS.

## 2023-07-05 NOTE — Assessment & Plan Note (Signed)

## 2023-07-05 NOTE — Patient Instructions (Signed)

## 2023-07-06 LAB — CBC
Hematocrit: 35.1 % (ref 34.0–46.6)
Hemoglobin: 10.8 g/dL — ABNORMAL LOW (ref 11.1–15.9)
MCH: 24.4 pg — ABNORMAL LOW (ref 26.6–33.0)
MCHC: 30.8 g/dL — ABNORMAL LOW (ref 31.5–35.7)
MCV: 79 fL (ref 79–97)
Platelets: 252 10*3/uL (ref 150–450)
RBC: 4.43 x10E6/uL (ref 3.77–5.28)
RDW: 14.9 % (ref 11.7–15.4)
WBC: 4.6 10*3/uL (ref 3.4–10.8)

## 2023-07-06 LAB — LIPID PANEL
Chol/HDL Ratio: 3.7 {ratio} (ref 0.0–4.4)
Cholesterol, Total: 171 mg/dL (ref 100–199)
HDL: 46 mg/dL (ref >39)
LDL Chol Calc (NIH): 108 mg/dL — ABNORMAL HIGH (ref 0–99)
Triglycerides: 94 mg/dL (ref 0–149)
VLDL Cholesterol Cal: 17 mg/dL (ref 5–40)

## 2023-07-06 LAB — MICROALBUMIN / CREATININE URINE RATIO
Creatinine, Urine: 64.8 mg/dL
Microalb/Creat Ratio: 26 mg/g{creat} (ref 0–29)
Microalbumin, Urine: 16.8 ug/mL

## 2023-07-06 LAB — HEMOGLOBIN A1C
Est. average glucose Bld gHb Est-mCnc: 137 mg/dL
Hgb A1c MFr Bld: 6.4 % — ABNORMAL HIGH (ref 4.8–5.6)

## 2023-07-06 LAB — CMP14+EGFR
ALT: 4 [IU]/L (ref 0–32)
AST: 13 [IU]/L (ref 0–40)
Albumin: 4 g/dL (ref 3.8–4.9)
Alkaline Phosphatase: 95 [IU]/L (ref 44–121)
BUN/Creatinine Ratio: 18 (ref 9–23)
BUN: 24 mg/dL (ref 6–24)
Bilirubin Total: 0.3 mg/dL (ref 0.0–1.2)
CO2: 23 mmol/L (ref 20–29)
Calcium: 9.6 mg/dL (ref 8.7–10.2)
Chloride: 99 mmol/L (ref 96–106)
Creatinine, Ser: 1.34 mg/dL — ABNORMAL HIGH (ref 0.57–1.00)
Globulin, Total: 3.6 g/dL (ref 1.5–4.5)
Glucose: 93 mg/dL (ref 70–99)
Potassium: 4.3 mmol/L (ref 3.5–5.2)
Sodium: 136 mmol/L (ref 134–144)
Total Protein: 7.6 g/dL (ref 6.0–8.5)
eGFR: 47 mL/min/{1.73_m2} — ABNORMAL LOW (ref >59)

## 2023-07-06 LAB — TSH: TSH: 0.611 u[IU]/mL (ref 0.450–4.500)

## 2023-07-09 LAB — FERRITIN: Ferritin: 151 ng/mL — ABNORMAL HIGH (ref 15–150)

## 2023-07-09 LAB — IRON AND TIBC
Iron Saturation: 20 % (ref 15–55)
Iron: 58 ug/dL (ref 27–159)
Total Iron Binding Capacity: 285 ug/dL (ref 250–450)
UIBC: 227 ug/dL (ref 131–425)

## 2023-07-09 LAB — SPECIMEN STATUS REPORT

## 2023-07-09 LAB — T4, FREE: Free T4: 1.53 ng/dL (ref 0.82–1.77)

## 2023-07-10 DIAGNOSIS — E66812 Obesity, class 2: Secondary | ICD-10-CM | POA: Insufficient documentation

## 2023-07-10 DIAGNOSIS — Z6838 Body mass index (BMI) 38.0-38.9, adult: Secondary | ICD-10-CM | POA: Insufficient documentation

## 2023-07-10 DIAGNOSIS — E7800 Pure hypercholesterolemia, unspecified: Secondary | ICD-10-CM | POA: Insufficient documentation

## 2023-07-10 NOTE — Assessment & Plan Note (Signed)
Chronic, well controlled.  EKG performed, NSR w/o acute changes. She will continue with telmisartan 40mg  daily and chlorthaddone 25mg  on MWF only.  Encouraged to follow low sodium diet. No med changes.

## 2023-07-10 NOTE — Assessment & Plan Note (Signed)
Chronic, LDL goal is less than 70.  She will continue with simvastatin daily. Encouraged to follow heart healthy lifestyle.

## 2023-07-10 NOTE — Assessment & Plan Note (Signed)
She is encouraged to strive for BMI less than 30 to decrease cardiac risk. Advised to aim for at least 150 minutes of exercise per week.  

## 2023-08-12 ENCOUNTER — Other Ambulatory Visit: Payer: Self-pay | Admitting: Internal Medicine

## 2023-08-12 DIAGNOSIS — E7800 Pure hypercholesterolemia, unspecified: Secondary | ICD-10-CM

## 2023-08-16 ENCOUNTER — Other Ambulatory Visit: Payer: Self-pay | Admitting: Internal Medicine

## 2023-11-08 ENCOUNTER — Encounter: Payer: Self-pay | Admitting: Internal Medicine

## 2023-11-08 ENCOUNTER — Ambulatory Visit: Payer: BC Managed Care – PPO | Admitting: Internal Medicine

## 2023-11-08 VITALS — BP 120/80 | HR 91 | Temp 98.1°F | Ht 60.0 in | Wt 200.0 lb

## 2023-11-08 DIAGNOSIS — E7800 Pure hypercholesterolemia, unspecified: Secondary | ICD-10-CM

## 2023-11-08 DIAGNOSIS — E1122 Type 2 diabetes mellitus with diabetic chronic kidney disease: Secondary | ICD-10-CM

## 2023-11-08 DIAGNOSIS — I129 Hypertensive chronic kidney disease with stage 1 through stage 4 chronic kidney disease, or unspecified chronic kidney disease: Secondary | ICD-10-CM | POA: Diagnosis not present

## 2023-11-08 DIAGNOSIS — Z23 Encounter for immunization: Secondary | ICD-10-CM | POA: Diagnosis not present

## 2023-11-08 DIAGNOSIS — N1831 Chronic kidney disease, stage 3a: Secondary | ICD-10-CM | POA: Diagnosis not present

## 2023-11-08 DIAGNOSIS — Z1231 Encounter for screening mammogram for malignant neoplasm of breast: Secondary | ICD-10-CM

## 2023-11-08 MED ORDER — ATORVASTATIN CALCIUM 20 MG PO TABS
20.0000 mg | ORAL_TABLET | Freq: Every day | ORAL | 1 refills | Status: DC
Start: 2023-11-08 — End: 2024-05-07

## 2023-11-08 NOTE — Assessment & Plan Note (Addendum)
Chronic, she is currently asymptomatic. She has been taking Rybelsus without any complications.  However, she has been out for 3 weeks. Her previous insurance did not cover Rybelsus.  She will continue with Rybelsus 3mg  daily. I will send rx to start PA process today.  She will rto in four months for re-evaluation.

## 2023-11-08 NOTE — Assessment & Plan Note (Addendum)
Unfortunately, she did not respond to Mychart message regarding medication change. She will complete simvastatin 20mg  and switch to atorvastatin when she runs out. I will send rx atorvastatin 20mg  daily today. She will let me know when she starts new medication

## 2023-11-08 NOTE — Assessment & Plan Note (Signed)
Chronic, well controlled.  She will continue with telmisartan 40mg  daily and chlorthalidone 25mg  on MWF only.  Encouraged to follow low sodium diet. No med changes today.

## 2023-11-08 NOTE — Patient Instructions (Signed)

## 2023-11-08 NOTE — Progress Notes (Signed)
I,Jameka J Llittleton, CMA,acting as a Neurosurgeon for Gwynneth Aliment, MD.,have documented all relevant documentation on the behalf of Gwynneth Aliment, MD,as directed by  Gwynneth Aliment, MD while in the presence of Gwynneth Aliment, MD.  Subjective:  Patient ID: Bridget Bates , female    DOB: 1965-11-10 , 58 y.o.   MRN: [id number]  Chief Complaint  Patient presents with   Diabetes   Hypertension    HPI  The patient is here today for a follow-up on her diabetes and blood pressure.  She reports compliance with meds. She denies having any headaches, chest pain and shortness of breath.   Patient reports she had the flu about 2 weeks ago, she feels better overall but she does still have a cough.  Diabetes She presents for her follow-up diabetic visit. She has type 2 diabetes mellitus. Her disease course has been stable. There are no hypoglycemic associated symptoms. Pertinent negatives for diabetes include no blurred vision. There are no hypoglycemic complications. Risk factors for coronary artery disease include diabetes mellitus, dyslipidemia, hypertension, sedentary lifestyle and obesity. She never participates in exercise. Her breakfast blood glucose is taken between 8-9 am. Her breakfast blood glucose range is generally 110-130 mg/dl. An ACE inhibitor/angiotensin II receptor blocker is being taken. Eye exam is not current.  Hypertension This is a chronic problem. The current episode started more than 1 year ago. The problem has been gradually improving since onset. The problem is uncontrolled. Pertinent negatives include no blurred vision or palpitations. The current treatment provides moderate improvement.     Past Medical History:  Diagnosis Date   Diabetes mellitus without complication (HCC)    Hypertension    Obesity      Family History  Problem Relation Age of Onset   Diabetes Mother    Healthy Father      Current Outpatient Medications:    atorvastatin (LIPITOR) 20 MG tablet,  Take 1 tablet (20 mg total) by mouth daily., Disp: 90 tablet, Rfl: 1   Blood Glucose Monitoring Suppl (ONETOUCH VERIO) w/Device KIT, USE ONCE DAILY TO CHECK BLOOD SUGARS., Disp: 1 kit, Rfl: 0   chlorthalidone (HYGROTON) 25 MG tablet, TAKE 1 TABLET BY MOUTH ON MONDAY, WEDNESDAY, FRIDAY ONLY, Disp: 90 tablet, Rfl: 2   Dulaglutide (TRULICITY) 0.75 MG/0.5ML SOPN, Inject 0.75 mg into the skin once a week., Disp: 2 mL, Rfl: 3   glucose blood (ONETOUCH VERIO) test strip, Use as instructed to check blood sugars dx code- e11.65, Disp: 100 each, Rfl: 5   Lancets (ONETOUCH ULTRASOFT) lancets, Use as instructed, Disp: 100 each, Rfl: 12   metFORMIN (GLUCOPHAGE) 500 MG tablet, TAKE 1 TABLET(500 MG) BY MOUTH DAILY WITH BREAKFAST, Disp: 90 tablet, Rfl: 1   telmisartan (MICARDIS) 40 MG tablet, TAKE 1 TABLET(40 MG) BY MOUTH DAILY, Disp: 90 tablet, Rfl: 2   Vitamin D, Cholecalciferol, 25 MCG (1000 UT) CAPS, Take 1 capsule by mouth daily at 12 noon., Disp: , Rfl:    Allergies  Allergen Reactions   Amoxicillin Hives and Itching     Review of Systems  Constitutional: Negative.   Eyes: Negative.  Negative for blurred vision.  Respiratory: Negative.    Cardiovascular: Negative.  Negative for palpitations.  Gastrointestinal: Negative.   Musculoskeletal: Negative.   Skin: Negative.   Psychiatric/Behavioral: Negative.       Today's Vitals   11/08/23 1102  BP: 120/80  Pulse: 91  Temp: 98.1 F (36.7 C)  TempSrc: Oral  Weight: 200 lb (90.7  kg)  Height: 5' (1.524 m)  PainSc: 0-No pain   Body mass index is 39.06 kg/m.  Wt Readings from Last 3 Encounters:  11/08/23 200 lb (90.7 kg)  07/05/23 203 lb 12.8 oz (92.4 kg)  03/14/23 207 lb 12.8 oz (94.3 kg)    The 10-year ASCVD risk score (Arnett DK, et al., 2019) is: 6.9%   Values used to calculate the score:     Age: 71 years     Sex: Female     Is Non-Hispanic African American: Yes     Diabetic: Yes     Tobacco smoker: No     Systolic Blood Pressure:  120 mmHg     Is BP treated: No     HDL Cholesterol: 46 mg/dL     Total Cholesterol: 171 mg/dL  Objective:  Physical Exam Vitals and nursing note reviewed.  Constitutional:      Appearance: Normal appearance. She is obese.  HENT:     Head: Normocephalic and atraumatic.  Eyes:     Extraocular Movements: Extraocular movements intact.  Cardiovascular:     Rate and Rhythm: Normal rate and regular rhythm.     Heart sounds: Normal heart sounds.  Pulmonary:     Effort: Pulmonary effort is normal.     Breath sounds: Normal breath sounds.  Musculoskeletal:     Cervical back: Normal range of motion.  Skin:    General: Skin is warm.  Neurological:     General: No focal deficit present.     Mental Status: She is alert.  Psychiatric:        Mood and Affect: Mood normal.        Behavior: Behavior normal.         Assessment And Plan:  Type 2 diabetes mellitus with stage 3a chronic kidney disease, without long-term current use of insulin (HCC) Assessment & Plan: Chronic, she is currently asymptomatic. She has been taking Rybelsus without any complications.  However, she has been out for 3 weeks. Her previous insurance did not cover Rybelsus.  She will continue with Rybelsus 3mg  daily. I will send rx to start PA process today.  She will rto in four months for re-evaluation.   Orders: -     Hemoglobin A1c -     BMP8+eGFR  Hypertensive nephropathy Assessment & Plan: Chronic, well controlled.  She will continue with telmisartan 40mg  daily and chlorthalidone 25mg  on MWF only.  Encouraged to follow low sodium diet. No med changes today.    Pure hypercholesterolemia Assessment & Plan: Unfortunately, she did not respond to Mychart message regarding medication change. She will complete simvastatin 20mg  and switch to atorvastatin when she runs out. I will send rx atorvastatin 20mg  daily today. She will let me know when she starts new medication   Immunization due -     Pneumococcal  conjugate vaccine 20-valent  Encounter for screening mammogram for malignant neoplasm of breast -     Digital Screening Mammogram, Left and Right; Future  Other orders -     Atorvastatin Calcium; Take 1 tablet (20 mg total) by mouth daily.  Dispense: 90 tablet; Refill: 1    Return for Uncontrolled BP check-3/4 months.  Patient was given opportunity to ask questions. Patient verbalized understanding of the plan and was able to repeat key elements of the plan. All questions were answered to their satisfaction.    I, Gwynneth Aliment, MD, have reviewed all documentation for this visit. The documentation on 11/08/23 for the  exam, diagnosis, procedures, and orders are all accurate and complete.   IF YOU HAVE BEEN REFERRED TO A SPECIALIST, IT MAY TAKE 1-2 WEEKS TO SCHEDULE/PROCESS THE REFERRAL. IF YOU HAVE NOT HEARD FROM US/SPECIALIST IN TWO WEEKS, PLEASE GIVE [place] A CALL AT [phone].

## 2023-11-09 LAB — BMP8+EGFR
BUN/Creatinine Ratio: 12 (ref 9–23)
BUN: 17 mg/dL (ref 6–24)
CO2: 25 mmol/L (ref 20–29)
Calcium: 9.6 mg/dL (ref 8.7–10.2)
Chloride: 100 mmol/L (ref 96–106)
Creatinine, Ser: 1.38 mg/dL — ABNORMAL HIGH (ref 0.57–1.00)
Glucose: 88 mg/dL (ref 70–99)
Potassium: 4.1 mmol/L (ref 3.5–5.2)
Sodium: 139 mmol/L (ref 134–144)
eGFR: 45 mL/min/{1.73_m2} — ABNORMAL LOW (ref >59)

## 2023-11-09 LAB — HEMOGLOBIN A1C
Est. average glucose Bld gHb Est-mCnc: 146 mg/dL
Hgb A1c MFr Bld: 6.7 % — ABNORMAL HIGH (ref 4.8–5.6)

## 2023-11-24 ENCOUNTER — Ambulatory Visit: Payer: BC Managed Care – PPO

## 2023-11-24 ENCOUNTER — Ambulatory Visit
Admission: RE | Admit: 2023-11-24 | Discharge: 2023-11-24 | Disposition: A | Discharge status: Home / Self Care | Payer: BC Managed Care – PPO | Source: Ambulatory Visit | Attending: Internal Medicine | Admitting: Internal Medicine

## 2023-11-24 DIAGNOSIS — Z1231 Encounter for screening mammogram for malignant neoplasm of breast: Secondary | ICD-10-CM

## 2023-12-07 ENCOUNTER — Telehealth: Payer: Self-pay

## 2023-12-08 ENCOUNTER — Other Ambulatory Visit: Payer: Self-pay | Admitting: Internal Medicine

## 2023-12-08 DIAGNOSIS — N1831 Chronic kidney disease, stage 3a: Secondary | ICD-10-CM

## 2023-12-13 ENCOUNTER — Other Ambulatory Visit: Payer: Self-pay

## 2023-12-13 DIAGNOSIS — N1831 Chronic kidney disease, stage 3a: Secondary | ICD-10-CM

## 2023-12-13 MED ORDER — RYBELSUS 3 MG PO TABS
1.0000 | ORAL_TABLET | Freq: Every day | ORAL | 2 refills | Status: DC
Start: 2023-12-13 — End: 2023-12-21

## 2023-12-21 ENCOUNTER — Other Ambulatory Visit: Payer: Self-pay

## 2023-12-21 DIAGNOSIS — N1831 Chronic kidney disease, stage 3a: Secondary | ICD-10-CM

## 2023-12-21 MED ORDER — RYBELSUS 3 MG PO TABS: 1.0000 | ORAL_TABLET | Freq: Every day | ORAL | 2 refills | Status: DC

## 2024-02-21 ENCOUNTER — Encounter: Payer: Self-pay | Admitting: Internal Medicine

## 2024-02-21 ENCOUNTER — Ambulatory Visit (INDEPENDENT_AMBULATORY_CARE_PROVIDER_SITE_OTHER): Payer: BC Managed Care – PPO | Admitting: Internal Medicine

## 2024-02-21 VITALS — BP 112/80 | HR 74 | Temp 99.0°F | Ht 60.0 in | Wt 203.2 lb

## 2024-02-21 DIAGNOSIS — E1122 Type 2 diabetes mellitus with diabetic chronic kidney disease: Secondary | ICD-10-CM

## 2024-02-21 DIAGNOSIS — E66812 Obesity, class 2: Secondary | ICD-10-CM

## 2024-02-21 DIAGNOSIS — N1831 Chronic kidney disease, stage 3a: Secondary | ICD-10-CM

## 2024-02-21 DIAGNOSIS — E7800 Pure hypercholesterolemia, unspecified: Secondary | ICD-10-CM | POA: Diagnosis not present

## 2024-02-21 DIAGNOSIS — I129 Hypertensive chronic kidney disease with stage 1 through stage 4 chronic kidney disease, or unspecified chronic kidney disease: Secondary | ICD-10-CM

## 2024-02-21 DIAGNOSIS — Z6839 Body mass index (BMI) 39.0-39.9, adult: Secondary | ICD-10-CM

## 2024-02-21 MED ORDER — RYBELSUS 3 MG PO TABS
1.0000 | ORAL_TABLET | Freq: Every day | ORAL | 2 refills | Status: DC
Start: 2024-02-21 — End: 2024-04-04

## 2024-02-21 NOTE — Assessment & Plan Note (Signed)
 She is now currently on atorvastatin  20mg  daily. No issues with meds.

## 2024-02-21 NOTE — Assessment & Plan Note (Signed)
 Generally well-controlled with occasional dietary-related elevations. Morning glucose 115-135 mg/dL. Current regimen includes Rybelsus  and metformin . - Order A1c test. - Refill Rybelsus  90-day supply. - Continue metformin . - Schedule follow-up based on A1c results; anticipate September if stable. - Schedule eye exam ASAP - F/u 4 months

## 2024-02-21 NOTE — Assessment & Plan Note (Signed)
 Chronic, controlled. She will continue with telmisartan  40mg  and chlorthalidone  25mg  daily.  - Encouraged to follow low sodium diet.

## 2024-02-21 NOTE — Assessment & Plan Note (Signed)
 She is encouraged to strive for BMI less than 30 to decrease cardiac risk. Advised to aim for at least 150 minutes of exercise per week.

## 2024-02-21 NOTE — Patient Instructions (Signed)
 Hypertension, Adult Hypertension is another name for high blood pressure. High blood pressure forces your heart to work harder to pump blood. This can cause problems over time. There are two numbers in a blood pressure reading. There is a top number (systolic) over a bottom number (diastolic). It is best to have a blood pressure that is below 120/80. What are the causes? The cause of this condition is not known. Some other conditions can lead to high blood pressure. What increases the risk? Some lifestyle factors can make you more likely to develop high blood pressure: Smoking. Not getting enough exercise or physical activity. Being overweight. Having too much fat, sugar, calories, or salt (sodium) in your diet. Drinking too much alcohol. Other risk factors include: Having any of these conditions: Heart disease. Diabetes. High cholesterol. Kidney disease. Obstructive sleep apnea. Having a family history of high blood pressure and high cholesterol. Age. The risk increases with age. Stress. What are the signs or symptoms? High blood pressure may not cause symptoms. Very high blood pressure (hypertensive crisis) may cause: Headache. Fast or uneven heartbeats (palpitations). Shortness of breath. Nosebleed. Vomiting or feeling like you may vomit (nauseous). Changes in how you see. Very bad chest pain. Feeling dizzy. Seizures. How is this treated? This condition is treated by making healthy lifestyle changes, such as: Eating healthy foods. Exercising more. Drinking less alcohol. Your doctor may prescribe medicine if lifestyle changes do not help enough and if: Your top number is above 130. Your bottom number is above 80. Your personal target blood pressure may vary. Follow these instructions at home: Eating and drinking  If told, follow the DASH eating plan. To follow this plan: Fill one half of your plate at each meal with fruits and vegetables. Fill one fourth of your plate  at each meal with whole grains. Whole grains include whole-wheat pasta, brown rice, and whole-grain bread. Eat or drink low-fat dairy products, such as skim milk or low-fat yogurt. Fill one fourth of your plate at each meal with low-fat (lean) proteins. Low-fat proteins include fish, chicken without skin, eggs, beans, and tofu. Avoid fatty meat, cured and processed meat, or chicken with skin. Avoid pre-made or processed food. Limit the amount of salt in your diet to less than 1,500 mg each day. Do not drink alcohol if: Your doctor tells you not to drink. You are pregnant, may be pregnant, or are planning to become pregnant. If you drink alcohol: Limit how much you have to: 0-1 drink a day for women. 0-2 drinks a day for men. Know how much alcohol is in your drink. In the [place] one drink equals one 12 oz bottle of beer (355 mL), one 5 oz glass of wine (148 mL), or one 1 oz glass of hard liquor (44 mL). Lifestyle  Work with your doctor to stay at a healthy weight or to lose weight. Ask your doctor what the best weight is for you. Get at least 30 minutes of exercise that causes your heart to beat faster (aerobic exercise) most days of the week. This may include walking, swimming, or biking. Get at least 30 minutes of exercise that strengthens your muscles (resistance exercise) at least 3 days a week. This may include lifting weights or doing Pilates. Do not smoke or use any products that contain nicotine or tobacco. If you need help quitting, ask your doctor. Check your blood pressure at home as told by your doctor. Keep all follow-up visits. Medicines Take over-the-counter and prescription medicines  only as told by your doctor. Follow directions carefully. Do not skip doses of blood pressure medicine. The medicine does not work as well if you skip doses. Skipping doses also puts you at risk for problems. Ask your doctor about side effects or reactions to medicines that you should watch  for. Contact a doctor if: You think you are having a reaction to the medicine you are taking. You have headaches that keep coming back. You feel dizzy. You have swelling in your ankles. You have trouble with your vision. Get help right away if: You get a very bad headache. You start to feel mixed up (confused). You feel weak or numb. You feel faint. You have very bad pain in your: Chest. Belly (abdomen). You vomit more than once. You have trouble breathing. These symptoms may be an emergency. Get help right away. Call 911. Do not wait to see if the symptoms will go away. Do not drive yourself to the hospital. Summary Hypertension is another name for high blood pressure. High blood pressure forces your heart to work harder to pump blood. For most people, a normal blood pressure is less than 120/80. Making healthy choices can help lower blood pressure. If your blood pressure does not get lower with healthy choices, you may need to take medicine. This information is not intended to replace advice given to you by your health care provider. Make sure you discuss any questions you have with your health care provider. Document Revised: 07/02/2021 Document Reviewed: 07/02/2021 Elsevier Patient Education  2024 [employer].

## 2024-02-21 NOTE — Progress Notes (Signed)
 I,Victoria T Basil Lim, CMA,acting as a Neurosurgeon for Smiley Dung, MD.,have documented all relevant documentation on the behalf of Smiley Dung, MD,as directed by  Smiley Dung, MD while in the presence of Smiley Dung, MD.  Subjective:  Patient ID: Bridget Bates , female    DOB: [birth year] , 58 y.o.   MRN: [id number]  Chief Complaint  Patient presents with   Hypertension    Patient presents today for bp, diabetes & cholesterol Check. Patient doesn't have any questions or concerns. Denies headaches, chest pain & sob    Diabetes    HPI Discussed the use of AI scribe software for clinical note transcription with the patient, who gave verbal consent to proceed.  History of Present Illness Bridget Bates is a 58 year old female with diabetes and hypertension who presents for a diabetes check.  She monitors her blood glucose levels, though not consistently. Her highest recorded blood glucose was 140 mg/dL, which she attributes to candy consumption. Her typical morning blood glucose levels range from 115 to 135 mg/dL.  She is currently on telmisartan  40 mg daily for hypertension. Her diabetes management includes Rybelsus  3 mg, which is fully covered by her insurance, and metformin  once daily. There was a mention of two prescriptions for metformin , but she clarified she takes one tablet daily.   She takes clopidogrel 25 mg daily and atorvastatin  for cholesterol management, which she takes every night. Her last recorded cholesterol level was 108 mg/dL. Her insurance now covers atorvastatin , which she previously paid for out-of-pocket.  She has a history of anemia and is scheduled to see a nephrologist next month. She maintains adequate hydration and has fasted today in preparation for lab tests, including A1c, cholesterol, and kidney function.  She mentioned the need to complete an eye exam soon.   Diabetes She presents for her follow-up diabetic visit. She has type 2 diabetes mellitus.  Her disease course has been stable. There are no hypoglycemic associated symptoms. Pertinent negatives for diabetes include no blurred vision, no chest pain, no polydipsia, no polyphagia and no polyuria. There are no hypoglycemic complications. Risk factors for coronary artery disease include diabetes mellitus, dyslipidemia, hypertension, sedentary lifestyle and obesity. She never participates in exercise. Her breakfast blood glucose is taken between 8-9 am. Her breakfast blood glucose range is generally 110-130 mg/dl. An ACE inhibitor/angiotensin II receptor blocker is being taken. Eye exam is not current.  Hypertension This is a chronic problem. The current episode started more than 1 year ago. The problem has been gradually improving since onset. The problem is uncontrolled. Pertinent negatives include no blurred vision, chest pain, palpitations or shortness of breath. The current treatment provides moderate improvement.     Past Medical History:  Diagnosis Date   Diabetes mellitus without complication (HCC)    Hypertension    Obesity      Family History  Problem Relation Age of Onset   Diabetes Mother    Healthy Father      Current Outpatient Medications:    atorvastatin  (LIPITOR) 20 MG tablet, Take 1 tablet (20 mg total) by mouth daily., Disp: 90 tablet, Rfl: 1   Blood Glucose Monitoring Suppl (ONETOUCH VERIO FLEX SYSTEM) w/Device KIT, USE ONCE DAILY TO CHECK BLOOD SUGARS, Disp: 1 kit, Rfl: 1   chlorthalidone  (HYGROTON ) 25 MG tablet, TAKE 1 TABLET BY MOUTH ON MONDAY, WEDNESDAY, FRIDAY ONLY, Disp: 90 tablet, Rfl: 2   glucose blood (ONETOUCH VERIO) test strip, Use as instructed to check blood  sugars dx code- e11.65, Disp: 100 each, Rfl: 5   Lancets (ONETOUCH ULTRASOFT) lancets, Use as instructed, Disp: 100 each, Rfl: 12   metFORMIN  (GLUCOPHAGE ) 500 MG tablet, TAKE 1 TABLET(500 MG) BY MOUTH DAILY WITH BREAKFAST, Disp: 90 tablet, Rfl: 1   telmisartan  (MICARDIS ) 40 MG tablet, TAKE 1  TABLET(40 MG) BY MOUTH DAILY, Disp: 90 tablet, Rfl: 2   Vitamin D , Cholecalciferol, 25 MCG (1000 UT) CAPS, Take 1 capsule by mouth daily at 12 noon., Disp: , Rfl:    Semaglutide  (RYBELSUS ) 3 MG TABS, Take 1 tablet (3 mg total) by mouth daily at 6 (six) AM., Disp: 90 tablet, Rfl: 2   Allergies  Allergen Reactions   Amoxicillin Hives and Itching     Review of Systems  Constitutional: Negative.   Eyes:  Negative for blurred vision.  Respiratory: Negative.  Negative for shortness of breath.   Cardiovascular: Negative.  Negative for chest pain and palpitations.  Endocrine: Negative for polydipsia, polyphagia and polyuria.  Neurological: Negative.   Psychiatric/Behavioral: Negative.       Today's Vitals   02/21/24 1112  BP: 112/80  Pulse: 74  Temp: 99 F (37.2 C)  SpO2: 98%  Weight: 203 lb 3.2 oz (92.2 kg)  Height: 5' (1.524 m)   Body mass index is 39.68 kg/m.  Wt Readings from Last 3 Encounters:  02/21/24 203 lb 3.2 oz (92.2 kg)  11/08/23 200 lb (90.7 kg)  07/05/23 203 lb 12.8 oz (92.4 kg)     Objective:  Physical Exam Vitals and nursing note reviewed.  Constitutional:      Appearance: Normal appearance.  HENT:     Head: Normocephalic and atraumatic.  Eyes:     Extraocular Movements: Extraocular movements intact.  Cardiovascular:     Rate and Rhythm: Normal rate and regular rhythm.     Heart sounds: Normal heart sounds.  Pulmonary:     Effort: Pulmonary effort is normal.     Breath sounds: Normal breath sounds.  Musculoskeletal:     Cervical back: Normal range of motion.  Skin:    General: Skin is warm.  Neurological:     General: No focal deficit present.     Mental Status: She is alert.  Psychiatric:        Mood and Affect: Mood normal.        Behavior: Behavior normal.         Assessment And Plan:  Hypertensive nephropathy Assessment & Plan: Chronic, controlled. She will continue with telmisartan  40mg  and chlorthalidone  25mg  daily.  - Encouraged to  follow low sodium diet.   Orders: -     CMP14+EGFR -     Lipid panel  Type 2 diabetes mellitus with stage 3a chronic kidney disease, without long-term current use of insulin (HCC) Assessment & Plan: Generally well-controlled with occasional dietary-related elevations. Morning glucose 115-135 mg/dL. Current regimen includes Rybelsus  and metformin . - Order A1c test. - Refill Rybelsus  90-day supply. - Continue metformin . - Schedule follow-up based on A1c results; anticipate September if stable. - Schedule eye exam ASAP - F/u 4 months  Orders: -     CMP14+EGFR -     Lipid panel -     Hemoglobin A1c -     Rybelsus ; Take 1 tablet (3 mg total) by mouth daily at 6 (six) AM.  Dispense: 90 tablet; Refill: 2  Pure hypercholesterolemia Assessment & Plan: She is now currently on atorvastatin  20mg  daily. No issues with meds.   Orders: -  CMP14+EGFR -     Lipid panel  Class 2 severe obesity due to excess calories with serious comorbidity and body mass index (BMI) of 39.0 to 39.9 in adult [facility]) Assessment & Plan: She is encouraged to strive for BMI less than 30 to decrease cardiac risk. Advised to aim for at least 150 minutes of exercise per week.      Return if symptoms worsen or fail to improve.  Patient was given opportunity to ask questions. Patient verbalized understanding of the plan and was able to repeat key elements of the plan. All questions were answered to their satisfaction.   I, Smiley Dung, MD, have reviewed all documentation for this visit. The documentation on 02/21/24 for the exam, diagnosis, procedures, and orders are all accurate and complete.   IF YOU HAVE BEEN REFERRED TO A SPECIALIST, IT MAY TAKE 1-2 WEEKS TO SCHEDULE/PROCESS THE REFERRAL. IF YOU HAVE NOT HEARD FROM US /SPECIALIST IN TWO WEEKS, PLEASE GIVE US  A CALL AT [phone].   THE PATIENT IS ENCOURAGED TO PRACTICE SOCIAL DISTANCING DUE TO THE COVID-19 PANDEMIC.

## 2024-02-22 ENCOUNTER — Ambulatory Visit: Payer: Self-pay | Admitting: Internal Medicine

## 2024-02-22 LAB — CMP14+EGFR
ALT: 5 IU/L (ref 0–32)
AST: 12 IU/L (ref 0–40)
Albumin: 4 g/dL (ref 3.8–4.9)
Alkaline Phosphatase: 95 IU/L (ref 44–121)
BUN/Creatinine Ratio: 20 (ref 9–23)
BUN: 28 mg/dL — ABNORMAL HIGH (ref 6–24)
Bilirubin Total: 0.4 mg/dL (ref 0.0–1.2)
CO2: 23 mmol/L (ref 20–29)
Calcium: 9.3 mg/dL (ref 8.7–10.2)
Chloride: 102 mmol/L (ref 96–106)
Creatinine, Ser: 1.4 mg/dL — ABNORMAL HIGH (ref 0.57–1.00)
Globulin, Total: 3.3 g/dL (ref 1.5–4.5)
Glucose: 73 mg/dL (ref 70–99)
Potassium: 4.6 mmol/L (ref 3.5–5.2)
Sodium: 139 mmol/L (ref 134–144)
Total Protein: 7.3 g/dL (ref 6.0–8.5)
eGFR: 44 mL/min/{1.73_m2} — ABNORMAL LOW (ref >59)

## 2024-02-22 LAB — LIPID PANEL
Chol/HDL Ratio: 3.1 ratio (ref 0.0–4.4)
Cholesterol, Total: 165 mg/dL (ref 100–199)
HDL: 54 mg/dL (ref >39)
LDL Chol Calc (NIH): 94 mg/dL (ref 0–99)
Triglycerides: 92 mg/dL (ref 0–149)
VLDL Cholesterol Cal: 17 mg/dL (ref 5–40)

## 2024-02-22 LAB — HEMOGLOBIN A1C
Est. average glucose Bld gHb Est-mCnc: 128 mg/dL
Hgb A1c MFr Bld: 6.1 % — ABNORMAL HIGH (ref 4.8–5.6)

## 2024-04-04 ENCOUNTER — Telehealth: Payer: Self-pay | Admitting: Internal Medicine

## 2024-04-04 DIAGNOSIS — N1831 Chronic kidney disease, stage 3a: Secondary | ICD-10-CM

## 2024-04-04 NOTE — Telephone Encounter (Unsigned)
 Copied from CRM [id number]. Topic: Clinical - Medication Refill >> Apr 04, 2024 10:53 AM Carlatta H wrote: Medication: Semaglutide  (RYBELSUS ) 3 MG TABS [[id number]]  Has the patient contacted their pharmacy? No (Agent: If no, request that the patient contact the pharmacy for the refill. If patient does not wish to contact the pharmacy document the reason why and proceed with request.) (Agent: If yes, when and what did the pharmacy advise?)  This is the patient's preferred pharmacy:  [id number] - [place], [place] - [address] AT [facility] [address] [place]  [address] Phone: [phone] Fax: [phone]  Is this the correct pharmacy for this prescription? Yes If no, delete pharmacy and type the correct one.   Has the prescription been filled recently? No  Is the patient out of the medication? Yes  Has the patient been seen for an appointment in the last year OR does the patient have an upcoming appointment? Yes  Can we respond through MyChart? No  Agent: Please be advised that Rx refills may take up to 3 business days. We ask that you follow-up with your pharmacy.

## 2024-04-05 MED ORDER — RYBELSUS 3 MG PO TABS: 1.0000 | ORAL_TABLET | Freq: Every day | ORAL | 2 refills | Status: AC

## 2024-04-20 ENCOUNTER — Other Ambulatory Visit: Payer: Self-pay | Admitting: Internal Medicine

## 2024-04-20 DIAGNOSIS — E7800 Pure hypercholesterolemia, unspecified: Secondary | ICD-10-CM

## 2024-05-02 ENCOUNTER — Other Ambulatory Visit: Payer: Self-pay | Admitting: Internal Medicine

## 2024-05-02 DIAGNOSIS — E7800 Pure hypercholesterolemia, unspecified: Secondary | ICD-10-CM

## 2024-05-03 ENCOUNTER — Other Ambulatory Visit: Payer: Self-pay | Admitting: Internal Medicine

## 2024-05-03 DIAGNOSIS — E7800 Pure hypercholesterolemia, unspecified: Secondary | ICD-10-CM

## 2024-05-07 ENCOUNTER — Other Ambulatory Visit: Payer: Self-pay

## 2024-05-07 ENCOUNTER — Ambulatory Visit: Payer: Self-pay | Admitting: *Deleted

## 2024-05-07 MED ORDER — ATORVASTATIN CALCIUM 20 MG PO TABS: 20.0000 mg | ORAL_TABLET | Freq: Every day | ORAL | 1 refills | Status: DC

## 2024-05-07 NOTE — Telephone Encounter (Signed)
 FYI Only or Action Required?: FYI only for provider.  Patient was last seen in primary care on 02/21/2024 by Jarold Medici, MD.  Called Nurse Triage reporting Arm Pain.  Symptoms began several days ago.  Interventions attempted: OTC medications: pain patch.  Symptoms are: gradually worsening.  Triage Disposition: See PCP When Office is Open (Within 3 Days)  Patient/caregiver understands and will follow disposition?: Yes   Reason for Disposition  [1] MODERATE pain (e.g., interferes with normal activities) AND [2] present > 3 days  Answer Assessment - Initial Assessment Questions 1. ONSET: When did the pain start?     Stiffness in arm 2. LOCATION: Where is the pain located?     Saturday 3. PAIN: How bad is the pain? (Scale 0-10; or none, mild, moderate, severe)     6/10 4. WORK OR EXERCISE: Has there been any recent work or exercise that involved this part of the body?     Unknown- possible positional  5. CAUSE: What do you think is causing the arm pain?     Arthritis- patient has had to get cortisone shot before, using OTC pain patch 6. OTHER SYMPTOMS: Do you have any other symptoms? (e.g., neck pain, swelling, rash, fever, numbness, weakness)     Tender, unable to lift higher than shoulder/head  Protocols used: Arm Pain-A-AH   Copied from CRM #[id number]. Topic: Clinical - Red Word Triage >> May 07, 2024 10:45 AM Bridget Bates: Patient says her arm are very stiff due to arthritis. Last time she got a shot. It's affecting her work she wants to know what she can do. Been this way the whole weekend can barely hold her arm up-Right

## 2024-05-09 ENCOUNTER — Encounter: Payer: Self-pay | Admitting: Family Medicine

## 2024-05-09 ENCOUNTER — Ambulatory Visit (INDEPENDENT_AMBULATORY_CARE_PROVIDER_SITE_OTHER): Payer: Self-pay | Admitting: Family Medicine

## 2024-05-09 VITALS — BP 100/60 | HR 98 | Temp 98.2°F | Ht 60.0 in | Wt 204.0 lb

## 2024-05-09 VITALS — BP 100/60 | HR 98 | Temp 98.2°F | Resp 18 | Ht 60.0 in | Wt 204.0 lb

## 2024-05-09 DIAGNOSIS — E1122 Type 2 diabetes mellitus with diabetic chronic kidney disease: Secondary | ICD-10-CM | POA: Diagnosis not present

## 2024-05-09 DIAGNOSIS — N1831 Chronic kidney disease, stage 3a: Secondary | ICD-10-CM | POA: Diagnosis not present

## 2024-05-09 DIAGNOSIS — Z6839 Body mass index (BMI) 39.0-39.9, adult: Secondary | ICD-10-CM

## 2024-05-09 DIAGNOSIS — M79601 Pain in right arm: Secondary | ICD-10-CM | POA: Diagnosis not present

## 2024-05-09 DIAGNOSIS — E66812 Obesity, class 2: Secondary | ICD-10-CM

## 2024-05-09 MED ORDER — METHOCARBAMOL 500 MG PO TABS: 500.0000 mg | ORAL_TABLET | Freq: Two times a day (BID) | ORAL | 0 refills | Status: AC | PRN

## 2024-05-09 NOTE — Progress Notes (Signed)
 Bridget Bates, CMA,acting as a Neurosurgeon for [employer], NP.,have documented all relevant documentation on the behalf of Bridget Creighton, NP,as directed by  Bridget Creighton, NP while in the presence of Bridget Creighton, NP.  Subjective:  Patient ID: Bridget Bates , female    DOB: [birth year] , 58 y.o.   MRN: [id number]  Chief Complaint  Patient presents with   Arm Pain    Patient presents today for right arm pain. She reports the muscles in her arm were so sore in it. She reports the pain started on Saturday. She reports the pain is better today and she is able to lift her arm up.    HPI Discussed the use of AI scribe software for clinical note transcription with the patient, who gave verbal consent to proceed.  History of Present Illness     Bridget Bates is a 58 year old female with a history of arthritis who presents with right arm pain and stiffness.  She woke up on Saturday with severe pain and stiffness in her right arm, rating the pain as nine out of ten. Initially, she was unable to lift her arm, but by today, she can raise it fully, although it remains slightly tender in the muscle. She has been using a pain patch and a heating pad at night to manage the symptoms.  She recalls a previous diagnosis of arthritis in the arm from two years ago, for which she received a cortisone shot that alleviated the symptoms at that time. The pain and stiffness impacted her ability to perform daily activities, including self-care and work duties, over the weekend. She had difficulty using her right arm and relied on coworkers for assistance at work.  Her current medications include clonidine, which she takes every Monday, Wednesday, and Friday, and a cholesterol medication that she recently refilled after a delay due to pharmacy issues. No leg cramping and no pain at the time of the visit.  She recently moved into a new house and is actively working on settling in. She works in a job that requires physical  activity and interaction with clients.  BMI is 39.8. She is encouraged to strive for BMI less than 30 to decrease cardiac risk. Advised to aim for at least 150 minutes of exercise per week.      Past Medical History:  Diagnosis Date   Diabetes mellitus without complication (HCC)    Hypertension    Obesity      Family History  Problem Relation Age of Onset   Diabetes Mother    Healthy Father      Current Outpatient Medications:    atorvastatin  (LIPITOR) 20 MG tablet, Take 1 tablet (20 mg total) by mouth daily., Disp: 90 tablet, Rfl: 1   Blood Glucose Monitoring Suppl (ONETOUCH VERIO FLEX SYSTEM) w/Device KIT, USE ONCE DAILY TO CHECK BLOOD SUGARS, Disp: 1 kit, Rfl: 1   chlorthalidone  (HYGROTON ) 25 MG tablet, TAKE 1 TABLET BY MOUTH ON MONDAY, WEDNESDAY, FRIDAY ONLY, Disp: 90 tablet, Rfl: 2   glucose blood (ONETOUCH VERIO) test strip, Use as instructed to check blood sugars dx code- e11.65, Disp: 100 each, Rfl: 5   Lancets (ONETOUCH ULTRASOFT) lancets, Use as instructed, Disp: 100 each, Rfl: 12   metFORMIN  (GLUCOPHAGE ) 500 MG tablet, TAKE 1 TABLET(500 MG) BY MOUTH DAILY WITH BREAKFAST, Disp: 90 tablet, Rfl: 1   methocarbamol  (ROBAXIN ) 500 MG tablet, Take 1 tablet (500 mg total) by mouth 2 (two) times daily as needed for muscle spasms.,  Disp: 30 tablet, Rfl: 0   Semaglutide  (RYBELSUS ) 3 MG TABS, Take 1 tablet (3 mg total) by mouth daily at 6 (six) AM., Disp: 90 tablet, Rfl: 2   telmisartan  (MICARDIS ) 40 MG tablet, TAKE 1 TABLET(40 MG) BY MOUTH DAILY, Disp: 90 tablet, Rfl: 2   Vitamin D , Cholecalciferol, 25 MCG (1000 UT) CAPS, Take 1 capsule by mouth daily at 12 noon., Disp: , Rfl:    Allergies  Allergen Reactions   Amoxicillin Hives and Itching     Review of Systems  Constitutional: Negative.   HENT: Negative.    Respiratory: Negative.    Cardiovascular: Negative.   Gastrointestinal: Negative.   Genitourinary: Negative.   Musculoskeletal:  Positive for arthralgias.  Skin:  Negative.      Today's Vitals   05/09/24 1612  BP: 100/60  Pulse: 98  Temp: 98.2 F (36.8 C)  TempSrc: Oral  Weight: 204 lb (92.5 kg)  Height: 5' (1.524 m)  PainSc: 2   PainLoc: Arm   Body mass index is 39.84 kg/m.  Wt Readings from Last 3 Encounters:  05/09/24 204 lb (92.5 kg)  02/21/24 203 lb 3.2 oz (92.2 kg)  11/08/23 200 lb (90.7 kg)    The 10-year ASCVD risk score (Arnett DK, et al., 2019) is: 3.2%   Values used to calculate the score:     Age: 77 years     Clincally relevant sex: Female     Is Non-Hispanic African American: Yes     Diabetic: Yes     Tobacco smoker: No     Systolic Blood Pressure: 100 mmHg     Is BP treated: No     HDL Cholesterol: 54 mg/dL     Total Cholesterol: 165 mg/dL  Objective:  Physical Exam HENT:     Head: Normocephalic.  Cardiovascular:     Rate and Rhythm: Normal rate and regular rhythm.  Pulmonary:     Effort: Pulmonary effort is normal.     Breath sounds: Normal breath sounds.  Musculoskeletal:        General: Tenderness present.  Skin:    General: Skin is warm and dry.  Neurological:     General: No focal deficit present.     Mental Status: She is alert and oriented to person, place, and time.         Assessment And Plan:  Musculoskeletal arm pain, right Assessment & Plan: Acute right arm pain and stiffness likely due to sleeping position, resolved with mild tenderness remaining. Previous arthritis treated with cortisone injection. - Prescribed non-drowsy muscle relaxant as needed. - Recommended over-the-counter pain cream such as Bengay or capsaicin cream for muscle soreness. - Advised use of heating pad as needed.  Orders: -     Methocarbamol ; Take 1 tablet (500 mg total) by mouth 2 (two) times daily as needed for muscle spasms.  Dispense: 30 tablet; Refill: 0  Type 2 diabetes mellitus with stage 3a chronic kidney disease, without long-term current use of insulin (HCC) Assessment & Plan: Type 2 diabetes mellitus  managed with rybelsus .  Advised to schedule ophthalmology appointment ASAP   Class 2 severe obesity due to excess calories with serious comorbidity and body mass index (BMI) of 39.0 to 39.9 in adult [facility]) Assessment & Plan: She is encouraged to strive for BMI less than 30 to decrease cardiac risk. Advised to aim for at least 150 minutes of exercise per week.      Assessment & Plan Acute right arm pain and stiffness,  resolved Acute right arm pain and stiffness likely due to sleeping position, resolved with mild tenderness remaining. Previous arthritis treated with cortisone injection. - Prescribed non-drowsy muscle relaxant as needed. - Recommended over-the-counter pain cream such as Bengay or capsaicin cream for muscle soreness. - Advised use of heating pad as needed.  Type 2 diabetes mellitus without complications Type 2 diabetes mellitus managed with medication.  Hyperlipidemia Hyperlipidemia managed with medication, recent prescription refill issue resolved.  Essential hypertension Essential hypertension managed with clonidine three times a week, emphasized maintaining hydration. - Ensure adequate hydration.   Return if symptoms worsen or fail to improve, for keep appt.  Patient was given opportunity to ask questions. Patient verbalized understanding of the plan and was able to repeat key elements of the plan. All questions were answered to their satisfaction.    I, Bridget Creighton, NP, have reviewed all documentation for this visit. The documentation on 06/01/2024 for the exam, diagnosis, procedures, and orders are all accurate and complete.   IF YOU HAVE BEEN REFERRED TO A SPECIALIST, IT MAY TAKE 1-2 WEEKS TO SCHEDULE/PROCESS THE REFERRAL. IF YOU HAVE NOT HEARD FROM US /SPECIALIST IN TWO WEEKS, PLEASE GIVE US  A CALL AT [phone].

## 2024-06-01 NOTE — Assessment & Plan Note (Addendum)
 Type 2 diabetes mellitus managed with rybelsus .  Advised to schedule ophthalmology appointment ASAP

## 2024-06-01 NOTE — Assessment & Plan Note (Signed)
 Acute right arm pain and stiffness likely due to sleeping position, resolved with mild tenderness remaining. Previous arthritis treated with cortisone injection. - Prescribed non-drowsy muscle relaxant as needed. - Recommended over-the-counter pain cream such as Bengay or capsaicin cream for muscle soreness. - Advised use of heating pad as needed.

## 2024-06-01 NOTE — Assessment & Plan Note (Addendum)
 She is encouraged to strive for BMI less than 30 to decrease cardiac risk. Advised to aim for at least 150 minutes of exercise per week.

## 2024-07-08 ENCOUNTER — Other Ambulatory Visit: Payer: Self-pay | Admitting: Internal Medicine

## 2024-07-10 ENCOUNTER — Encounter: Payer: Self-pay | Admitting: Internal Medicine

## 2024-07-10 ENCOUNTER — Ambulatory Visit: Payer: Self-pay | Admitting: Internal Medicine

## 2024-07-10 VITALS — BP 124/80 | Temp 98.3°F | Ht 60.0 in | Wt 198.9 lb

## 2024-07-10 DIAGNOSIS — E7800 Pure hypercholesterolemia, unspecified: Secondary | ICD-10-CM | POA: Diagnosis not present

## 2024-07-10 DIAGNOSIS — Z0000 Encounter for general adult medical examination without abnormal findings: Secondary | ICD-10-CM | POA: Diagnosis not present

## 2024-07-10 DIAGNOSIS — E1122 Type 2 diabetes mellitus with diabetic chronic kidney disease: Secondary | ICD-10-CM

## 2024-07-10 DIAGNOSIS — N1831 Chronic kidney disease, stage 3a: Secondary | ICD-10-CM | POA: Diagnosis not present

## 2024-07-10 DIAGNOSIS — Z8249 Family history of ischemic heart disease and other diseases of the circulatory system: Secondary | ICD-10-CM

## 2024-07-10 DIAGNOSIS — Z6838 Body mass index (BMI) 38.0-38.9, adult: Secondary | ICD-10-CM

## 2024-07-10 DIAGNOSIS — E66812 Obesity, class 2: Secondary | ICD-10-CM

## 2024-07-10 DIAGNOSIS — I129 Hypertensive chronic kidney disease with stage 1 through stage 4 chronic kidney disease, or unspecified chronic kidney disease: Secondary | ICD-10-CM

## 2024-07-10 LAB — POCT URINALYSIS DIP (CLINITEK)
Bilirubin, UA: NEGATIVE
Blood, UA: NEGATIVE
Glucose, UA: NEGATIVE mg/dL
Ketones, POC UA: NEGATIVE mg/dL
Nitrite, UA: NEGATIVE
POC PROTEIN,UA: NEGATIVE
Spec Grav, UA: 1.01 (ref 1.010–1.025)
Urobilinogen, UA: 0.2 U/dL
pH, UA: 5.5 (ref 5.0–8.0)

## 2024-07-10 MED ORDER — ATORVASTATIN CALCIUM 20 MG PO TABS: 20.0000 mg | ORAL_TABLET | Freq: Every day | ORAL | 1 refills | Status: DC

## 2024-07-10 MED ORDER — TELMISARTAN 40 MG PO TABS: 40.0000 mg | ORAL_TABLET | Freq: Every day | ORAL | 2 refills | Status: AC

## 2024-07-10 MED ORDER — METFORMIN HCL 500 MG PO TABS: 500.0000 mg | ORAL_TABLET | Freq: Every day | ORAL | 1 refills | Status: AC

## 2024-07-10 MED ORDER — CHLORTHALIDONE 25 MG PO TABS: ORAL_TABLET | ORAL | 2 refills | Status: AC

## 2024-07-10 NOTE — Assessment & Plan Note (Signed)

## 2024-07-10 NOTE — Assessment & Plan Note (Addendum)
 Chronic, diabetic foot exam was performed.  Type 2 diabetes with chronic kidney disease. Blood sugar levels were previously between 120-130 mg/dL. - Continue metformin  daily with breakfast. - Continue with Rybelsus  3mg  daily, she is intolerant of higher dose.  - Monitor blood sugar levels regularly. - Check urine for protein annually. - Most recent Renal note reviewed.  - Discuss potential need for additional medication if proteinuria develops. - I DISCUSSED WITH THE PATIENT AT LENGTH REGARDING THE GOALS OF GLYCEMIC CONTROL AND POSSIBLE LONG-TERM COMPLICATIONS.  I  ALSO STRESSED THE IMPORTANCE OF COMPLIANCE WITH HOME GLUCOSE MONITORING, DIETARY RESTRICTIONS INCLUDING AVOIDANCE OF SUGARY DRINKS/PROCESSED FOODS,  ALONG WITH REGULAR EXERCISE.  I  ALSO STRESSED THE IMPORTANCE OF ANNUAL EYE EXAMS, SELF FOOT CARE AND COMPLIANCE WITH OFFICE VISITS.

## 2024-07-10 NOTE — Assessment & Plan Note (Signed)
 Family history of heart disease with potential genetic predisposition. Explained lipoprotein A test and its implications. - Order lipoprotein A test. - Consider CT calcium  score if lipoprotein A is elevated, noting test is not covered by insurance and costs $99.

## 2024-07-10 NOTE — Assessment & Plan Note (Addendum)
 Chronic, fair control. Goal BP<130/80.  She admits she has been out of meds for several days. Hypertension well-controlled with current medication regimen. Blood pressure today is 124/80 mmHg, within target range. - Continue telmisartan  40 mg daily and chlorthalidone  3 days weekly.  - Encourage regular exercise. - Discuss importance of medication adherence.

## 2024-07-10 NOTE — Patient Instructions (Signed)

## 2024-07-10 NOTE — Assessment & Plan Note (Addendum)
 Chronic, LDL goal is less than 70.  Hypercholesterolemia managed with atorvastatin . - Continue atorvastatin  20 mg daily, preferably at night. - Monitor cholesterol levels with future lab tests. - Discuss potential for insurance to only cover a 30-day supply.
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
## 2024-07-11 ENCOUNTER — Ambulatory Visit: Payer: Self-pay | Admitting: Internal Medicine

## 2024-07-11 NOTE — Assessment & Plan Note (Signed)
 Class 2 obesity with recent weight loss of 6 pounds since August. - Encourage regular physical activity and healthy lifestyle choices.

## 2024-07-12 LAB — CMP14+EGFR
ALT: 7 IU/L (ref 0–32)
AST: 14 IU/L (ref 0–40)
Albumin: 4.2 g/dL (ref 3.8–4.9)
Alkaline Phosphatase: 103 IU/L (ref 49–135)
BUN/Creatinine Ratio: 18 (ref 9–23)
BUN: 24 mg/dL (ref 6–24)
Bilirubin Total: 0.3 mg/dL (ref 0.0–1.2)
CO2: 25 mmol/L (ref 20–29)
Calcium: 9.8 mg/dL (ref 8.7–10.2)
Chloride: 102 mmol/L (ref 96–106)
Creatinine, Ser: 1.34 mg/dL — ABNORMAL HIGH (ref 0.57–1.00)
Globulin, Total: 3.2 g/dL (ref 1.5–4.5)
Glucose: 102 mg/dL — ABNORMAL HIGH (ref 70–99)
Potassium: 4.2 mmol/L (ref 3.5–5.2)
Sodium: 139 mmol/L (ref 134–144)
Total Protein: 7.4 g/dL (ref 6.0–8.5)
eGFR: 46 mL/min/1.73 — ABNORMAL LOW (ref >59)

## 2024-07-12 LAB — LIPID PANEL
Chol/HDL Ratio: 3.4 ratio (ref 0.0–4.4)
Cholesterol, Total: 160 mg/dL (ref 100–199)
HDL: 47 mg/dL (ref >39)
LDL Chol Calc (NIH): 90 mg/dL (ref 0–99)
Triglycerides: 130 mg/dL (ref 0–149)
VLDL Cholesterol Cal: 23 mg/dL (ref 5–40)

## 2024-07-12 LAB — CBC
Hematocrit: 36.2 % (ref 34.0–46.6)
Hemoglobin: 10.9 g/dL — ABNORMAL LOW (ref 11.1–15.9)
MCH: 24.1 pg — ABNORMAL LOW (ref 26.6–33.0)
MCHC: 30.1 g/dL — ABNORMAL LOW (ref 31.5–35.7)
MCV: 80 fL (ref 79–97)
Platelets: 235 x10E3/uL (ref 150–450)
RBC: 4.52 x10E6/uL (ref 3.77–5.28)
RDW: 15.1 % (ref 11.7–15.4)
WBC: 4.9 x10E3/uL (ref 3.4–10.8)

## 2024-07-12 LAB — HEMOGLOBIN A1C
Est. average glucose Bld gHb Est-mCnc: 128 mg/dL
Hgb A1c MFr Bld: 6.1 % — ABNORMAL HIGH (ref 4.8–5.6)

## 2024-07-12 LAB — MICROALBUMIN / CREATININE URINE RATIO
Creatinine, Urine: 56.6 mg/dL
Microalb/Creat Ratio: 25 mg/g{creat} (ref 0–29)
Microalbumin, Urine: 14.4 ug/mL

## 2024-07-12 LAB — LIPOPROTEIN A (LPA): Lipoprotein (a): 314.9 nmol/L — ABNORMAL HIGH (ref <75.0)

## 2024-09-03 ENCOUNTER — Other Ambulatory Visit: Payer: Self-pay

## 2024-09-03 MED ORDER — ATORVASTATIN CALCIUM 40 MG PO TABS: ORAL_TABLET | ORAL | 1 refills | Status: AC

## 2024-10-02 ENCOUNTER — Other Ambulatory Visit: Payer: Self-pay | Admitting: Internal Medicine

## 2024-10-02 DIAGNOSIS — E7800 Pure hypercholesterolemia, unspecified: Secondary | ICD-10-CM

## 2024-10-02 DIAGNOSIS — E7841 Elevated Lipoprotein(a): Secondary | ICD-10-CM

## 2024-10-02 DIAGNOSIS — Z8249 Family history of ischemic heart disease and other diseases of the circulatory system: Secondary | ICD-10-CM

## 2024-11-13 ENCOUNTER — Ambulatory Visit: Payer: Self-pay | Admitting: Internal Medicine

## 2025-07-11 ENCOUNTER — Encounter: Payer: Self-pay | Admitting: Internal Medicine
